# Patient Record
Sex: Female | Born: 1990
Health system: Southern US, Community
[De-identification: ages and names within clinical notes are randomized; demographics above are authoritative.]

## PROBLEM LIST (undated history)

## (undated) HISTORY — PX: NO PAST SURGERIES: SHX2092

---
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
## 2011-03-16 ENCOUNTER — Emergency Department: Payer: Self-pay | Admitting: Unknown Physician Specialty

## 2011-08-13 ENCOUNTER — Emergency Department: Payer: Self-pay | Admitting: Emergency Medicine

## 2013-04-04 ENCOUNTER — Emergency Department: Payer: Self-pay | Admitting: Emergency Medicine

## 2013-04-04 LAB — URINALYSIS, COMPLETE
Glucose,UR: NEGATIVE mg/dL (ref 0–75)
Ketone: NEGATIVE
RBC,UR: 1 /HPF (ref 0–5)
Specific Gravity: 1.029 (ref 1.003–1.030)
Squamous Epithelial: 1
WBC UR: <1 /HPF (ref 0–5)

## 2013-05-29 ENCOUNTER — Emergency Department: Payer: Self-pay | Admitting: Emergency Medicine

## 2013-05-29 LAB — URINALYSIS, COMPLETE
Bilirubin,UR: NEGATIVE
Glucose,UR: NEGATIVE mg/dL (ref 0–75)
Ketone: NEGATIVE
Nitrite: POSITIVE
Ph: 5 (ref 4.5–8.0)
RBC,UR: 9 /HPF (ref 0–5)
Specific Gravity: 1.032 (ref 1.003–1.030)
Squamous Epithelial: 1
WBC UR: 1 /HPF (ref 0–5)

## 2013-05-29 LAB — CBC
HCT: 36.9 % (ref 35.0–47.0)
HGB: 12.8 g/dL (ref 12.0–16.0)
MCH: 27.8 pg (ref 26.0–34.0)
MCHC: 34.7 g/dL (ref 32.0–36.0)
MCV: 80 fL (ref 80–100)
RBC: 4.61 10*6/uL (ref 3.80–5.20)
RDW: 13.5 % (ref 11.5–14.5)

## 2013-05-29 LAB — LIPASE, BLOOD: Lipase: 120 U/L (ref 73–393)

## 2013-05-29 LAB — COMPREHENSIVE METABOLIC PANEL
Alkaline Phosphatase: 63 U/L (ref 50–136)
Anion Gap: 6 — ABNORMAL LOW (ref 7–16)
BUN: 13 mg/dL (ref 7–18)
Bilirubin,Total: 0.3 mg/dL (ref 0.2–1.0)
Chloride: 106 mmol/L (ref 98–107)
Co2: 25 mmol/L (ref 21–32)
EGFR (Non-African Amer.): >60
Glucose: 88 mg/dL (ref 65–99)
Potassium: 3.7 mmol/L (ref 3.5–5.1)
SGPT (ALT): 32 U/L (ref 12–78)
Sodium: 137 mmol/L (ref 136–145)
Total Protein: 7.1 g/dL (ref 6.4–8.2)

## 2013-05-29 LAB — PREGNANCY, URINE: Pregnancy Test, Urine: NEGATIVE m[IU]/mL

## 2013-05-29 LAB — GC/CHLAMYDIA PROBE AMP

## 2013-05-31 LAB — URINE CULTURE

## 2013-08-01 ENCOUNTER — Emergency Department: Payer: Self-pay | Admitting: Emergency Medicine

## 2014-05-21 ENCOUNTER — Encounter (HOSPITAL_BASED_OUTPATIENT_CLINIC_OR_DEPARTMENT_OTHER): Payer: Self-pay | Admitting: Emergency Medicine

## 2014-05-21 ENCOUNTER — Emergency Department (HOSPITAL_BASED_OUTPATIENT_CLINIC_OR_DEPARTMENT_OTHER)
Admission: EM | Admit: 2014-05-21 | Discharge: 2014-05-21 | Disposition: A | Discharge status: Home / Self Care | Payer: Self-pay | Attending: Emergency Medicine | Admitting: Emergency Medicine

## 2014-05-21 DIAGNOSIS — Y929 Unspecified place or not applicable: Secondary | ICD-10-CM | POA: Insufficient documentation

## 2014-05-21 DIAGNOSIS — S239XXA Sprain of unspecified parts of thorax, initial encounter: Secondary | ICD-10-CM | POA: Insufficient documentation

## 2014-05-21 DIAGNOSIS — S29012A Strain of muscle and tendon of back wall of thorax, initial encounter: Secondary | ICD-10-CM

## 2014-05-21 DIAGNOSIS — X58XXXA Exposure to other specified factors, initial encounter: Secondary | ICD-10-CM | POA: Insufficient documentation

## 2014-05-21 DIAGNOSIS — M545 Low back pain, unspecified: Secondary | ICD-10-CM | POA: Insufficient documentation

## 2014-05-21 DIAGNOSIS — R51 Headache: Secondary | ICD-10-CM | POA: Insufficient documentation

## 2014-05-21 DIAGNOSIS — Y939 Activity, unspecified: Secondary | ICD-10-CM | POA: Insufficient documentation

## 2014-05-21 DIAGNOSIS — S233XXA Sprain of ligaments of thoracic spine, initial encounter: Secondary | ICD-10-CM

## 2014-05-21 MED ORDER — CYCLOBENZAPRINE HCL 10 MG PO TABS: 10.0000 mg | ORAL_TABLET | Freq: Three times a day (TID) | ORAL | Status: DC | PRN

## 2014-05-21 NOTE — ED Provider Notes (Signed)
CSN: [id number]     Arrival date & time 05/21/14  1801 History  This chart was scribed for Geoffery Lyons, MD by Charline Bills, ED Scribe. The patient was seen in room MH01/MH01. Patient's care was started at 7:18 PM.   Chief Complaint  Patient presents with  . Headache   Patient is a 23 y.o. female presenting with headaches. The history is provided by the patient. No language interpreter was used.  Headache Pain location:  Generalized Chronicity:  New Associated symptoms: back pain    HPI Comments: Natasha Alvarez is a 23 y.o. female who presents to the Emergency Department complaining of constant HA onset last night. Pt states that she went to a carnival last night prior to pain.   She also reports intermittent generalized back pain over the past week, worsened since rides at the carnival last night. Pt denies injury. She denies urinary or bowel incontinence or any urinary symptoms. She also denies radiation. No h/o similar symptoms. Pt has taken 2 extra strength Tylenol tablets today with no relief.  History reviewed. No pertinent past medical history. History reviewed. No pertinent past surgical history. No family history on file. History  Substance Use Topics  . Smoking status: Never Smoker   . Smokeless tobacco: Not on file  . Alcohol Use: No   OB History   Grav Para Term Preterm Abortions TAB SAB Ect Mult Living                 Review of Systems  Genitourinary: Negative.   Musculoskeletal: Positive for back pain.  Neurological: Positive for headaches.  All other systems reviewed and are negative.  Allergies  Review of patient's allergies indicates no known allergies.  Home Medications   Prior to Admission medications   Not on File   Triage Vitals: BP 138/66  Pulse 72  Temp(Src) 98 F (36.7 C) (Oral)  Resp 16  Ht  (1.727 m)  Wt 220 lb (99.791 kg)  BMI 33.46 kg/m2  SpO2 100%  LMP 05/01/2014 Physical Exam  Nursing note and vitals  reviewed. Constitutional: She is oriented to person, place, and time. She appears well-developed and well-nourished. No distress.  HENT:  Head: Normocephalic and atraumatic.  Eyes: Conjunctivae and EOM are normal.  Neck: Neck supple. No tracheal deviation present.  Cardiovascular: Normal rate.   Pulmonary/Chest: Effort normal. No respiratory distress.  Musculoskeletal: Normal range of motion.  Tenderness to palpation in the soft tissues of the thoracic and lumbar region.   Neurological: She is alert and oriented to person, place, and time.  DTRs are 1+ and equal in BLEs. Strength is 5/5 in the BLEs. She is able to walk on heels and toes without difficulty.   Skin: Skin is warm and dry.  Psychiatric: She has a normal mood and affect. Her behavior is normal.   ED Course  Procedures (including critical care time) DIAGNOSTIC STUDIES: Oxygen Saturation is 100% on RA, normal by my interpretation.    COORDINATION OF CARE: 7:23 PM-Discussed treatment plan which includes muscle relaxants with pt at bedside and pt agreed to plan.   Labs Review Labs Reviewed - No data to display  Imaging Review No results found.   EKG Interpretation None      MDM   Final diagnoses:  None    Patient presents with complaints of headache and back pain. She is tender to palpation in the soft tissues of the thoracic and lumbar region, however there is no bony tenderness  and no trauma. Her reflexes and strength are symmetrical and there is no bowel or bladder complaint. This appears very musculoskeletal in nature and I believe is appropriate for discharge with anti-inflammatories and muscle relaxers. She is to return as needed for any problems.  I personally performed the services described in this documentation, which was scribed in my presence. The recorded information has been reviewed and is accurate.    Geoffery Lyons, MD 05/22/14 [phone]

## 2014-05-21 NOTE — Discharge Instructions (Signed)
Ibuprofen 600 mg 3 times daily for the next 3 days.  Flexeril as prescribed for pain not relieved with ibuprofen.  Followup with your primary Dr. if not improving in the next week.   Back Pain, Adult Low back pain is very common. About 1 in 5 people have back pain.The cause of low back pain is rarely dangerous. The pain often gets better over time.About half of people with a sudden onset of back pain feel better in just 2 weeks. About 8 in 10 people feel better by 6 weeks.  CAUSES Some common causes of back pain include:  Strain of the muscles or ligaments supporting the spine.  Wear and tear (degeneration) of the spinal discs.  Arthritis.  Direct injury to the back. DIAGNOSIS Most of the time, the direct cause of low back pain is not known.However, back pain can be treated effectively even when the exact cause of the pain is unknown.Answering your caregiver's questions about your overall health and symptoms is one of the most accurate ways to make sure the cause of your pain is not dangerous. If your caregiver needs more information, he or she may order lab work or imaging tests (X-rays or MRIs).However, even if imaging tests show changes in your back, this usually does not require surgery. HOME CARE INSTRUCTIONS For many people, back pain returns.Since low back pain is rarely dangerous, it is often a condition that people can learn to [facility] their own.   Remain active. It is stressful on the back to sit or stand in one place. Do not sit, drive, or stand in one place for more than 30 minutes at a time. Take short walks on level surfaces as soon as pain allows.Try to increase the length of time you walk each day.  Do not stay in bed.Resting more than 1 or 2 days can delay your recovery.  Do not avoid exercise or work.Your body is made to move.It is not dangerous to be active, even though your back may hurt.Your back will likely heal faster if you return to being active  before your pain is gone.  Pay attention to your body when you bend and lift. Many people have less discomfortwhen lifting if they bend their knees, keep the load close to their bodies,and avoid twisting. Often, the most comfortable positions are those that put less stress on your recovering back.  Find a comfortable position to sleep. Use a firm mattress and lie on your side with your knees slightly bent. If you lie on your back, put a pillow under your knees.  Only take over-the-counter or prescription medicines as directed by your caregiver. Over-the-counter medicines to reduce pain and inflammation are often the most helpful.Your caregiver may prescribe muscle relaxant drugs.These medicines help dull your pain so you can more quickly return to your normal activities and healthy exercise.  Put ice on the injured area.  Put ice in a plastic bag.  Place a towel between your skin and the bag.  Leave the ice on for 15-20 minutes, 03-04 times a day for the first 2 to 3 days. After that, ice and heat may be alternated to reduce pain and spasms.  Ask your caregiver about trying back exercises and gentle massage. This may be of some benefit.  Avoid feeling anxious or stressed.Stress increases muscle tension and can worsen back pain.It is important to recognize when you are anxious or stressed and learn ways to manage it.Exercise is a great option. SEEK MEDICAL CARE IF:  You have pain that is not relieved with rest or medicine.  You have pain that does not improve in 1 week.  You have new symptoms.  You are generally not feeling well. SEEK IMMEDIATE MEDICAL CARE IF:   You have pain that radiates from your back into your legs.  You develop new bowel or bladder control problems.  You have unusual weakness or numbness in your arms or legs.  You develop nausea or vomiting.  You develop abdominal pain.  You feel faint. Document Released: 08/17/2005 Document Revised: 02/16/2012  Document Reviewed: 12/19/2013 [facility] Patient Information 2015 [place], [place]. This information is not intended to replace advice given to you by your health care provider. Make sure you discuss any questions you have with your health care provider.

## 2014-05-21 NOTE — ED Notes (Signed)
Headache and back pain that started last night.

## 2014-05-21 NOTE — ED Notes (Signed)
MD at bedside. 

## 2014-07-20 ENCOUNTER — Emergency Department: Payer: Self-pay | Admitting: Emergency Medicine

## 2015-09-16 ENCOUNTER — Encounter: Payer: Self-pay | Admitting: *Deleted

## 2015-09-16 ENCOUNTER — Emergency Department
Admission: EM | Admit: 2015-09-16 | Discharge: 2015-09-16 | Disposition: A | Discharge status: Home / Self Care | Payer: Self-pay | Attending: Emergency Medicine | Admitting: Emergency Medicine

## 2015-09-16 DIAGNOSIS — K529 Noninfective gastroenteritis and colitis, unspecified: Secondary | ICD-10-CM | POA: Insufficient documentation

## 2015-09-16 DIAGNOSIS — Z3202 Encounter for pregnancy test, result negative: Secondary | ICD-10-CM | POA: Insufficient documentation

## 2015-09-16 LAB — CBC
HEMATOCRIT: 40.4 % (ref 35.0–47.0)
HEMOGLOBIN: 13.9 g/dL (ref 12.0–16.0)
MCH: 27.5 pg (ref 26.0–34.0)
MCHC: 34.3 g/dL (ref 32.0–36.0)
MCV: 80 fL (ref 80.0–100.0)
Platelets: 204 10*3/uL (ref 150–440)
RBC: 5.05 MIL/uL (ref 3.80–5.20)
RDW: 13 % (ref 11.5–14.5)
WBC: 4.6 10*3/uL (ref 3.6–11.0)

## 2015-09-16 LAB — URINALYSIS COMPLETE WITH MICROSCOPIC (ARMC ONLY)
Bacteria, UA: NONE SEEN
Bilirubin Urine: NEGATIVE
Glucose, UA: NEGATIVE mg/dL
Hgb urine dipstick: NEGATIVE
Leukocytes, UA: NEGATIVE
Nitrite: NEGATIVE
PH: 5 (ref 5.0–8.0)
Protein, ur: NEGATIVE mg/dL
Specific Gravity, Urine: 1.03 (ref 1.005–1.030)

## 2015-09-16 LAB — COMPREHENSIVE METABOLIC PANEL
ALT: 21 U/L (ref 14–54)
ANION GAP: 6 (ref 5–15)
AST: 17 U/L (ref 15–41)
Albumin: 3.9 g/dL (ref 3.5–5.0)
Alkaline Phosphatase: 50 U/L (ref 38–126)
BUN: 12 mg/dL (ref 6–20)
CALCIUM: 9.2 mg/dL (ref 8.9–10.3)
CO2: 25 mmol/L (ref 22–32)
Chloride: 109 mmol/L (ref 101–111)
Creatinine, Ser: 0.82 mg/dL (ref 0.44–1.00)
GFR calc non Af Amer: >60 mL/min (ref >60)
Glucose, Bld: 95 mg/dL (ref 65–99)
POTASSIUM: 3.8 mmol/L (ref 3.5–5.1)
Sodium: 140 mmol/L (ref 135–145)
Total Bilirubin: 0.7 mg/dL (ref 0.3–1.2)
Total Protein: 7.2 g/dL (ref 6.5–8.1)

## 2015-09-16 LAB — POCT PREGNANCY, URINE: Preg Test, Ur: NEGATIVE

## 2015-09-16 LAB — LIPASE, BLOOD: Lipase: 23 U/L (ref 11–51)

## 2015-09-16 MED ORDER — DICYCLOMINE HCL 20 MG PO TABS: 20.0000 mg | ORAL_TABLET | Freq: Three times a day (TID) | ORAL | Status: DC | PRN

## 2015-09-16 MED ORDER — ONDANSETRON 4 MG PO TBDP
4.0000 mg | ORAL_TABLET | Freq: Once | ORAL | Status: AC
  Administered 2015-09-16: 4 mg via ORAL
  Filled 2015-09-16: qty 1

## 2015-09-16 MED ORDER — ONDANSETRON HCL 4 MG PO TABS: 4.0000 mg | ORAL_TABLET | Freq: Every day | ORAL | Status: DC | PRN

## 2015-09-16 NOTE — Discharge Instructions (Signed)
Norovirus Infection °A norovirus infection is caused by exposure to a virus in a group of similar viruses (noroviruses). This type of infection causes inflammation in your stomach and intestines (gastroenteritis). Norovirus is the most common cause of gastroenteritis. It also causes food poisoning. °Anyone can get a norovirus infection. It spreads very easily (contagious). You can get it from contaminated food, water, surfaces, or other people. Norovirus is found in the stool or vomit of infected people. You can spread the infection as soon as you feel sick until 2 weeks after you recover.  °Symptoms usually begin within 2 days after you become infected. Most norovirus symptoms affect the digestive system. °CAUSES °Norovirus infection is caused by contact with norovirus. You can catch norovirus if you: °· Eat or drink something contaminated with norovirus. °· Touch surfaces or objects contaminated with norovirus and then put your hand in your mouth. °· Have direct contact with an infected person who has symptoms. °· Share food, drink, or utensils with someone with who is sick with norovirus. °SIGNS AND SYMPTOMS °Symptoms of norovirus may include: °· Nausea. °· Vomiting. °· Diarrhea. °· Stomach cramps. °· Fever. °· Chills. °· Headache. °· Muscle aches. °· Tiredness. °DIAGNOSIS °Your health care provider may suspect norovirus based on your symptoms and physical exam. Your health care provider may also test a sample of your stool or vomit for the virus.  °TREATMENT °There is no specific treatment for norovirus. Most people get better without treatment in about 2 days. °HOME CARE INSTRUCTIONS °· Replace lost fluids by drinking plenty of water or rehydration fluids containing important minerals called electrolytes. This prevents dehydration. Drink enough fluid to keep your urine clear or pale yellow. °· Do not prepare food for others while you are infected. Wait at least 3 days after recovering from the illness to do  that. °PREVENTION  °· Wash your hands often, especially after using the toilet or changing a diaper. °· Wash fruits and vegetables thoroughly before preparing or serving them. °· Throw out any food that a sick person may have touched. °· Disinfect contaminated surfaces immediately after someone in the household has been sick. Use a bleach-based household cleaner. °· Immediately remove and wash soiled clothes or sheets. °SEEK MEDICAL CARE IF: °· Your vomiting, diarrhea, and stomach pain is getting worse. °· Your symptoms of norovirus do not go away after 2-3 days. °SEEK IMMEDIATE MEDICAL CARE IF:  °You develop symptoms of dehydration that do not improve with fluid replacement. This may include: °· Excessive sleepiness. °· Lack of tears. °· Dry mouth. °· Dizziness when standing. °· Weak pulse. °  °This information is not intended to replace advice given to you by your health care provider. Make sure you discuss any questions you have with your health care provider. °  °Document Released: 11/07/2002 Document Revised: 09/07/2014 Document Reviewed: 01/25/2014 °Elsevier Interactive Patient Education ©2016 [employer] ° °

## 2015-09-16 NOTE — ED Notes (Signed)
Pt reports n/v/d since this am. Pt denies fevers. Pt reports cramping and watery stools. Pt in no acute distress at this time

## 2015-09-16 NOTE — ED Notes (Signed)
Pt reports nausea, vomiting, diarrhea and abdominal pain since last night, pt denies fever and any other symptoms

## 2015-09-16 NOTE — ED Provider Notes (Signed)
[facility] Emergency Department Provider Note     Time seen: ----------------------------------------- 7:26 PM on 09/16/2015 -----------------------------------------    I have reviewed the triage vital signs and the nursing notes.   HISTORY  Chief Complaint Abdominal Pain and Emesis    HPI Natasha Alvarez is a 25 y.o. female who presents ER with nausea vomiting diarrhea since this morning. Patient denies any fevers or chills, reports crampy abdominal pain and watery stools. She states she was sick with vomiting on Thursday, symptoms recurred today. She denies any other complaints, states she is lactose intolerant and some of her symptoms started after eating cereal with milk today   History reviewed. No pertinent past medical history.  There are no active problems to display for this patient.   History reviewed. No pertinent past surgical history.  Allergies Review of patient's allergies indicates no known allergies.  Social History Social History  Substance Use Topics  . Smoking status: Never Smoker   . Smokeless tobacco: None  . Alcohol Use: No    Review of Systems Constitutional: Negative for fever. Eyes: Negative for visual changes. ENT: Negative for sore throat. Cardiovascular: Negative for chest pain. Respiratory: Negative for shortness of breath. Gastrointestinal: Positive for abdominal pain, vomiting and diarrhea Genitourinary: Negative for dysuria. Musculoskeletal: Negative for back pain. Skin: Negative for rash. Neurological: Negative for headaches, focal weakness or numbness.  10-point ROS otherwise negative.  ____________________________________________   PHYSICAL EXAM:  VITAL SIGNS: ED Triage Vitals  Enc Vitals Group     BP 09/16/15 1830 136/77 mmHg     Pulse Rate 09/16/15 1830 73     Resp 09/16/15 1830 20     Temp 09/16/15 1830 98.2 F (36.8 C)     Temp Source 09/16/15 1830 Oral     SpO2 09/16/15 1830 97 %      Weight 09/16/15 1830 240 lb (108.863 kg)     Height 09/16/15 1830 5\' 8"  (1.727 m)     Head Cir --      Peak Flow --      Pain Score 09/16/15 1831 9     Pain Loc --      Pain Edu? --      Excl. in GC? --     Constitutional: Alert and oriented. Well appearing and in no distress. Eyes: Conjunctivae are normal. PERRL. Normal extraocular movements. ENT   Head: Normocephalic and atraumatic.   Nose: No congestion/rhinnorhea.   Mouth/Throat: Mucous membranes are moist.   Neck: No stridor. Cardiovascular: Normal rate, regular rhythm. Normal and symmetric distal pulses are present in all extremities. No murmurs, rubs, or gallops. Respiratory: Normal respiratory effort without tachypnea nor retractions. Breath sounds are clear and equal bilaterally. No wheezes/rales/rhonchi. Gastrointestinal: Soft and nontender. No distention. No abdominal bruits.  Musculoskeletal: Nontender with normal range of motion in all extremities. No joint effusions.  No lower extremity tenderness nor edema. Neurologic:  Normal speech and language. No gross focal neurologic deficits are appreciated. Speech is normal. No gait instability. Skin:  Skin is warm, dry and intact. No rash noted. Psychiatric: Mood and affect are normal. Speech and behavior are normal. Patient exhibits appropriate insight and judgment. ____________________________________________  ED COURSE:  Pertinent labs & imaging results that were available during my care of the patient were reviewed by me and considered in my medical decision making (see chart for details). Patient is no acute distress, will check basic labs and reevaluate. ____________________________________________    LABS (pertinent positives/negatives)  Labs  Reviewed  URINALYSIS COMPLETEWITH MICROSCOPIC ([facility] ONLY) - Abnormal; Notable for the following:    Color, Urine AMBER (*)    APPearance CLEAR (*)    Ketones, ur TRACE (*)    Squamous Epithelial / LPF 0-5  (*)    All other components within normal limits  LIPASE, BLOOD  COMPREHENSIVE METABOLIC PANEL  CBC  POC URINE PREG, ED  POCT PREGNANCY, URINE   ____________________________________________  FINAL ASSESSMENT AND PLAN  Gastroenteritis  Plan: Patient with labs and imaging as dictated above. Patient likely with gastroenteritis. Patient's been on exam with reassuring lab work and urinalysis. She is not pregnant, will be discharged with bentyl and Zofran.   Emily Filbert, MD   Emily Filbert, MD 09/16/15 [phone]

## 2016-04-10 ENCOUNTER — Ambulatory Visit
Admission: EM | Admit: 2016-04-10 | Discharge: 2016-04-10 | Disposition: A | Discharge status: Home / Self Care | Payer: Worker's Compensation | Attending: Emergency Medicine | Admitting: Emergency Medicine

## 2016-04-10 ENCOUNTER — Encounter: Payer: Self-pay | Admitting: *Deleted

## 2016-04-10 DIAGNOSIS — S40261A Insect bite (nonvenomous) of right shoulder, initial encounter: Secondary | ICD-10-CM | POA: Diagnosis not present

## 2016-04-10 DIAGNOSIS — W57XXXA Bitten or stung by nonvenomous insect and other nonvenomous arthropods, initial encounter: Secondary | ICD-10-CM | POA: Diagnosis not present

## 2016-04-10 MED ORDER — LORATADINE 10 MG PO TABS: 10.0000 mg | ORAL_TABLET | Freq: Every day | ORAL | 0 refills | Status: DC

## 2016-04-10 MED ORDER — PREDNISONE 20 MG PO TABS: 40.0000 mg | ORAL_TABLET | Freq: Every day | ORAL | 0 refills | Status: DC

## 2016-04-10 MED ORDER — MUPIROCIN 2 % EX OINT: TOPICAL_OINTMENT | CUTANEOUS | 0 refills | Status: DC

## 2016-04-10 NOTE — ED Triage Notes (Signed)
Patient was bitten by a bug at work yesterday on her back.The bite location is below right shoulder blade posterior. Two red bumps are visible.

## 2016-04-10 NOTE — ED Provider Notes (Signed)
[facility] ____________________________________________  Time seen: Approximately 4:56 PM  I have reviewed the triage vital signs and the nursing notes.   HISTORY Chief Complaint Insect Bite  HPI Natasha Alvarez is a 25 y.o. female presents for evaluation of insect bite to right shoulder. 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 Patient reports that yesterday just prior to leaving work she felt a quick bite to her right posterior shoulder and states that when she got home she noticed that she had 3 bug bites. Patient states that she thought she saw the bug that bit her and it had wings. Denies any other insect bites. Denies any tick bites or tick detachment. Denies any recent sickness. Denies any other triggers. Denies any changes in foods, medicines, lotions, detergents or other contacts.  Patient states that the rash is very itchy. Denies pain. Denies any chest pain, shortness of breath, abdominal pain, dysuria, facial pain, facial swelling, oral swelling, wheezing, extremity pain or actually swelling. Patient states that she feels well other than the itchy rash. Patient states that the itching was unresolved with over-the-counter topical antibiotic and itch medicine.  Patient's last menstrual period was 03/24/2016. Denies concerns pregnancy. Patient states she is not sexually active at this time.   History reviewed. No pertinent past medical history.  There are no active problems to display for this patient.   History reviewed. No pertinent surgical history.  No current facility-administered medications for this encounter.   Current Outpatient Prescriptions:  .  cyclobenzaprine (FLEXERIL) 10 MG tablet, Take 1 tablet (10 mg total) by mouth 3 (three) times daily as needed for muscle spasms., Disp: 10 tablet, Rfl: 0 .  dicyclomine (BENTYL) 20 MG tablet, Take 1 tablet (20 mg total) by mouth 3 (three) times  daily as needed for spasms., Disp: 30 tablet, Rfl: 0 .  loratadine (CLARITIN) 10 MG tablet, Take 1 tablet (10 mg total) by mouth daily., Disp: 7 tablet, Rfl: 0 .  mupirocin ointment (BACTROBAN) 2 %, Apply three times a day for 5 days., Disp: 22 g, Rfl: 0 .  ondansetron (ZOFRAN) 4 MG tablet, Take 1 tablet (4 mg total) by mouth daily as needed for nausea or vomiting., Disp: 20 tablet, Rfl: 1 .  predniSONE (DELTASONE) 20 MG tablet, Take 2 tablets (40 mg total) by mouth daily., Disp: 6 tablet, Rfl: 0  Allergies Amoxicillin  History reviewed. No pertinent family history.  Social History Social History  Substance Use Topics  . Smoking status: Never Smoker  . Smokeless tobacco: Never Used  . Alcohol use No    Review of Systems Constitutional: No fever/chills Eyes: No visual changes. ENT: No sore throat. Cardiovascular: Denies chest pain. Respiratory: Denies shortness of breath. Gastrointestinal: No abdominal pain.  No nausea, no vomiting.  No diarrhea.  No constipation. Genitourinary: Negative for dysuria. Musculoskeletal: Negative for back pain. Skin: Negative for rash.As above. Neurological: Negative for headaches, focal weakness or numbness.  10-point ROS otherwise negative.  ____________________________________________   PHYSICAL EXAM:  VITAL SIGNS: ED Triage Vitals  Enc Vitals Group     BP 04/10/16 1613 124/68     Pulse Rate 04/10/16 1613 65     Resp 04/10/16 1613 18     Temp 04/10/16 1613 98.2 F (36.8 C)     Temp Source 04/10/16 1613 Oral     SpO2 04/10/16 1613 100 %     Weight --      Height --  Head Circumference --      Peak Flow --      Pain Score 04/10/16 1619 0     Pain Loc --      Pain Edu? --      Excl. in GC? --     Constitutional: Alert and oriented. Well appearing and in no acute distress. Eyes: Conjunctivae are normal. PERRL. EOMI. ENT      Head: Normocephalic and atraumatic.      Nose: No congestion/rhinnorhea.      Mouth/Throat: Mucous  membranes are moist.Oropharynx non-erythematous. Neck: No stridor. Supple without meningismus.  Hematological/Lymphatic/Immunilogical: No cervical lymphadenopathy. Cardiovascular: Normal rate, regular rhythm. Grossly normal heart sounds.  Good peripheral circulation. Respiratory: Normal respiratory effort without tachypnea nor retractions. Breath sounds are clear and equal bilaterally. No wheezes/rales/rhonchi.. Gastrointestinal: Soft and nontender. No distention. Normal Bowel sounds. No CVA tenderness. Musculoskeletal:  Nontender with normal range of motion in all extremities. No midline cervical, thoracic or lumbar tenderness to palpation. Bilateral pedal pulses equal and easily palpated.      Right lower leg:  No tenderness or edema.      Left lower leg:  No tenderness or edema.  Neurologic:  Normal speech and language. No gross focal neurologic deficits are appreciated. Speech is normal. No gait instability.  Skin:  Skin is warm, dry and intact. No rash noted. Except: Patient with urticarial appearing rash to right posterior shoulder that is clustered together with centered punctums, minimally erythematous, no fluctuance, no drainage, no induration, no surrounding erythema or swelling, pruritic. Psychiatric: Mood and affect are normal. Speech and behavior are normal. Patient exhibits appropriate insight and judgment   ___________________________________________   LABS (all labs ordered are listed, but only abnormal results are displayed)  Labs Reviewed - No data to display ____________________________________________  PROCEDURES Procedures     INITIAL IMPRESSION / ASSESSMENT AND PLAN / ED COURSE  Pertinent labs & imaging results that were available during my care of the patient were reviewed by me and considered in my medical decision making (see chart for details).  Well-appearing patient. No acute distress. Presents for evaluation of right posterior shoulder insect bites that  occurred yesterday. Patient denies tick bite or tick exposure. Denies pain. Patient states that the rash is very itchy. Patient right posterior shoulder with 3 areas that are slightly raised and urticarial. Rash appearance consistent local reaction from insect bite. Counseled regarding not to scratch and close monitoring at home. Will treat patient with 3 days of oral prednisone, oral Claritin and topical Bactroban.Discussed indication, risks and benefits of medications with patient.  Discussed follow up with Primary care physician this week. Discussed follow up and return parameters including no resolution or any worsening concerns. Patient verbalized understanding and agreed to plan.   ____________________________________________   FINAL CLINICAL IMPRESSION(S) / ED DIAGNOSES  Final diagnoses:  Insect bite of right shoulder with local reaction, initial encounter     Discharge Medication List as of 04/10/2016  5:00 PM    START taking these medications   Details  loratadine (CLARITIN) 10 MG tablet Take 1 tablet (10 mg total) by mouth daily., Starting Fri 04/10/2016, Normal    mupirocin ointment (BACTROBAN) 2 % Apply three times a day for 5 days., Normal    predniSONE (DELTASONE) 20 MG tablet Take 2 tablets (40 mg total) by mouth daily., Starting Fri 04/10/2016, Normal        Note: This dictation was prepared with Dragon dictation along with smaller phrase technology. Any transcriptional  errors that result from this process are unintentional.    Clinical Course      Renford Dills, NP 04/11/16 1023

## 2016-04-10 NOTE — Discharge Instructions (Signed)
Take medication as prescribed. Rest. Drink plenty of fluids. Avoid scratching.   Follow up with your primary care physician this week as needed. Return to Urgent care for new or worsening concerns.   

## 2017-10-10 ENCOUNTER — Other Ambulatory Visit: Payer: Self-pay

## 2017-10-10 ENCOUNTER — Emergency Department
Admission: EM | Admit: 2017-10-10 | Discharge: 2017-10-10 | Disposition: A | Discharge status: Home / Self Care | Payer: BLUE CROSS/BLUE SHIELD | Attending: Emergency Medicine | Admitting: Emergency Medicine

## 2017-10-10 ENCOUNTER — Encounter: Payer: Self-pay | Admitting: Emergency Medicine

## 2017-10-10 DIAGNOSIS — R1110 Vomiting, unspecified: Secondary | ICD-10-CM | POA: Insufficient documentation

## 2017-10-10 DIAGNOSIS — J029 Acute pharyngitis, unspecified: Secondary | ICD-10-CM | POA: Diagnosis not present

## 2017-10-10 DIAGNOSIS — Z79899 Other long term (current) drug therapy: Secondary | ICD-10-CM | POA: Insufficient documentation

## 2017-10-10 MED ORDER — ONDANSETRON 4 MG PO TBDP
4.0000 mg | ORAL_TABLET | Freq: Once | ORAL | Status: AC
  Administered 2017-10-10: 4 mg via ORAL
  Filled 2017-10-10: qty 1

## 2017-10-10 MED ORDER — GUAIFENESIN-CODEINE 100-10 MG/5ML PO SOLN
5.0000 mL | Freq: Once | ORAL | Status: AC
  Administered 2017-10-10: 5 mL via ORAL
  Filled 2017-10-10: qty 5

## 2017-10-10 MED ORDER — ONDANSETRON HCL 4 MG PO TABS: 4.0000 mg | ORAL_TABLET | Freq: Every day | ORAL | 0 refills | Status: AC | PRN

## 2017-10-10 MED ORDER — LIDOCAINE VISCOUS 2 % MT SOLN: 10.0000 mL | OROMUCOSAL | 0 refills | Status: DC | PRN

## 2017-10-10 MED ORDER — LIDOCAINE VISCOUS 2 % MT SOLN
15.0000 mL | Freq: Once | OROMUCOSAL | Status: AC
  Administered 2017-10-10: 15 mL via OROMUCOSAL
  Filled 2017-10-10: qty 15

## 2017-10-10 MED ORDER — GUAIFENESIN-CODEINE 100-10 MG/5ML PO SOLN: 5.0000 mL | Freq: Three times a day (TID) | ORAL | 0 refills | Status: DC | PRN

## 2017-10-10 NOTE — ED Triage Notes (Addendum)
Pt reports sore throat, URI sx, reports fatigue. Reports pain when coughing. Pt ambulatory to triage room.

## 2017-10-10 NOTE — ED Provider Notes (Signed)
[facility] Emergency Department Provider Note  ____________________________________________  Time seen: Approximately 8:44 AM  I have reviewed the triage vital signs and the nursing notes.   HISTORY  Chief Complaint Sore Throat    HPI Natasha Alvarez is a 27 y.o. female that presents to the emergency department for evaluation of sore throat and vomiting for 3 days.  Patient does not feel nauseated but she is vomiting certain cough medications.  She has been able to keep down Delsym and liquids. She has an occasional nonproductive cough.  Partner is sick with similar symptoms but is able to tolerate cough medication.  She is on her menstrual cycle.  No fever, chills, nasal congestion, abdominal pain.  History reviewed. No pertinent past medical history.  There are no active problems to display for this patient.   History reviewed. No pertinent surgical history.  Prior to Admission medications   Medication Sig Start Date End Date Taking? Authorizing Provider  cyclobenzaprine (FLEXERIL) 10 MG tablet Take 1 tablet (10 mg total) by mouth 3 (three) times daily as needed for muscle spasms. 05/21/14   Geoffery Lyons, MD  dicyclomine (BENTYL) 20 MG tablet Take 1 tablet (20 mg total) by mouth 3 (three) times daily as needed for spasms. 09/16/15 09/15/16  Emily Filbert, MD  guaiFENesin-codeine 100-10 MG/5ML syrup Take 5 mLs by mouth 3 (three) times daily as needed for cough. 10/10/17   Enid Derry, PA-C  lidocaine (XYLOCAINE) 2 % solution Use as directed 10 mLs in the mouth or throat as needed for mouth pain. 10/10/17   Enid Derry, PA-C  loratadine (CLARITIN) 10 MG tablet Take 1 tablet (10 mg total) by mouth daily. 04/10/16   Renford Dills, NP  mupirocin ointment (BACTROBAN) 2 % Apply three times a day for 5 days. 04/10/16   Renford Dills, NP  ondansetron [facility]) 4 MG tablet Take 1 tablet (4 mg total) by mouth daily as needed for nausea or vomiting. 10/10/17  10/10/18  Enid Derry, PA-C  predniSONE (DELTASONE) 20 MG tablet Take 2 tablets (40 mg total) by mouth daily. 04/10/16   Renford Dills, NP    Allergies Amoxicillin  No family history on file.  Social History Social History   Tobacco Use  . Smoking status: Never Smoker  . Smokeless tobacco: Never Used  Substance Use Topics  . Alcohol use: No  . Drug use: No     Review of Systems  Constitutional: No fever/chills Eyes: No visual changes. No discharge. ENT: Negative for congestion and rhinorrhea. Cardiovascular: No chest pain. Respiratory: Positive for cough. No SOB. Gastrointestinal: No abdominal pain. No diarrhea.  No constipation. Musculoskeletal: Positive for body aches. Skin: Negative for rash, abrasions, lacerations, ecchymosis. Neurological: Negative for headaches.   ____________________________________________   PHYSICAL EXAM:  VITAL SIGNS: ED Triage Vitals  Enc Vitals Group     BP 10/10/17 0817 (!) 143/81     Pulse Rate 10/10/17 0817 74     Resp 10/10/17 0817 16     Temp 10/10/17 0817 99 F (37.2 C)     Temp Source 10/10/17 0817 Oral     SpO2 10/10/17 0817 99 %     Weight 10/10/17 0818 238 lb (108 kg)     Height 10/10/17 0818 5\' 9"  (1.753 m)     Head Circumference --      Peak Flow --      Pain Score 10/10/17 0818 8     Pain Loc --  Pain Edu? --      Excl. in GC? --      Constitutional: Alert and oriented. Well appearing and in no acute distress. Eyes: Conjunctivae are normal. PERRL. EOMI. No discharge. Head: Atraumatic. ENT: No frontal and maxillary sinus tenderness.      Ears: Tympanic membranes pearly gray with good landmarks. No discharge.      Nose: No congestion/rhinnorhea.      Mouth/Throat: Mucous membranes are moist. Oropharynx mildly erythematous. Tonsils not enlarged. No exudates. Uvula midline. Neck: No stridor.   Hematological/Lymphatic/Immunilogical: No cervical lymphadenopathy. Cardiovascular: Normal rate, regular rhythm.   Good peripheral circulation. Respiratory: Normal respiratory effort without tachypnea or retractions. Lungs CTAB. Good air entry to the bases with no decreased or absent breath sounds. Gastrointestinal: Bowel sounds 4 quadrants. Soft and nontender to palpation. No guarding or rigidity. No palpable masses. No distention. Musculoskeletal: Full range of motion to all extremities. No gross deformities appreciated. Neurologic:  Normal speech and language. No gross focal neurologic deficits are appreciated.  Skin:  Skin is warm, dry and intact. No rash noted.   ____________________________________________   LABS (all labs ordered are listed, but only abnormal results are displayed)  Labs Reviewed - No data to display ____________________________________________  EKG   ____________________________________________  RADIOLOGY   No results found.  ____________________________________________    PROCEDURES  Procedure(s) performed:    Procedures    Medications  ondansetron (ZOFRAN-ODT) disintegrating tablet 4 mg (4 mg Oral Given 10/10/17 0922)  lidocaine (XYLOCAINE) 2 % viscous mouth solution 15 mL (15 mLs Mouth/Throat Given 10/10/17 0922)  guaiFENesin-codeine 100-10 MG/5ML solution 5 mL (5 mLs Oral Given 10/10/17 1024)     ____________________________________________   INITIAL IMPRESSION / ASSESSMENT AND PLAN / ED COURSE  Pertinent labs & imaging results that were available during my care of the patient were reviewed by me and considered in my medical decision making (see chart for details).  Review of the [place] CSRS was performed in accordance of the [facility] prior to dispensing any controlled drugs.    Patient's diagnosis is consistent with viral pharyngitis.  Vital signs and exam are reassuring.  Patient was given Zofran and has been able to tolerate cough medication and liquids since.  She felt better after medication.  Patient feels comfortable going home. Patient will be  discharged home with prescriptions for viscous lidocaine. Patient is to follow up with Robitussin, viscous lidocaine, Zofran as needed or otherwise directed. Patient is given ED precautions to return to the ED for any worsening or new symptoms.     ____________________________________________  FINAL CLINICAL IMPRESSION(S) / ED DIAGNOSES  Final diagnoses:  Viral pharyngitis      NEW MEDICATIONS STARTED DURING THIS VISIT:  ED Discharge Orders        Ordered    lidocaine (XYLOCAINE) 2 % solution  As needed     10/10/17 1048    guaiFENesin-codeine 100-10 MG/5ML syrup  3 times daily PRN     10/10/17 1048    ondansetron (ZOFRAN) 4 MG tablet  Daily PRN     10/10/17 1048          This chart was dictated using voice recognition software/Dragon. Despite best efforts to proofread, errors can occur which can change the meaning. Any change was purely unintentional.    Enid DerryWagner, Rishaan Gunner, PA-C 10/10/17 1153    Jeanmarie PlantMcShane, James A, MD 10/10/17 1236

## 2018-10-03 DIAGNOSIS — O3680X0 Pregnancy with inconclusive fetal viability, not applicable or unspecified: Secondary | ICD-10-CM | POA: Diagnosis not present

## 2018-10-10 DIAGNOSIS — O3680X0 Pregnancy with inconclusive fetal viability, not applicable or unspecified: Secondary | ICD-10-CM | POA: Diagnosis not present

## 2018-10-10 DIAGNOSIS — O200 Threatened abortion: Secondary | ICD-10-CM | POA: Diagnosis not present

## 2018-10-28 ENCOUNTER — Telehealth: Payer: Self-pay | Admitting: Obstetrics & Gynecology

## 2018-10-28 NOTE — Telephone Encounter (Signed)
We have received record from [facility] for patient to schedule appointment. I attempt to reach patient to schedule appointment no answer and voicemail box is not set up.

## 2018-10-31 ENCOUNTER — Telehealth: Payer: Self-pay

## 2018-10-31 NOTE — Telephone Encounter (Signed)
Pt got sick this morning at work; afterwards she had sharp pain across her stomach and upper back hurts really bad; crampy.  [phone]  VM not set up yet.

## 2018-11-09 ENCOUNTER — Other Ambulatory Visit: Payer: Self-pay

## 2018-11-09 ENCOUNTER — Other Ambulatory Visit (HOSPITAL_COMMUNITY)
Admission: RE | Admit: 2018-11-09 | Discharge: 2018-11-09 | Disposition: A | Discharge status: Home / Self Care | Payer: BLUE CROSS/BLUE SHIELD | Source: Ambulatory Visit | Attending: Maternal Newborn | Admitting: Maternal Newborn

## 2018-11-09 ENCOUNTER — Ambulatory Visit (INDEPENDENT_AMBULATORY_CARE_PROVIDER_SITE_OTHER): Payer: BLUE CROSS/BLUE SHIELD | Admitting: Maternal Newborn

## 2018-11-09 ENCOUNTER — Encounter: Payer: Self-pay | Admitting: Maternal Newborn

## 2018-11-09 VITALS — BP 118/70 | HR 84 | Wt 229.0 lb

## 2018-11-09 DIAGNOSIS — Z3401 Encounter for supervision of normal first pregnancy, first trimester: Secondary | ICD-10-CM | POA: Insufficient documentation

## 2018-11-09 DIAGNOSIS — Z124 Encounter for screening for malignant neoplasm of cervix: Secondary | ICD-10-CM

## 2018-11-09 DIAGNOSIS — Z3A10 10 weeks gestation of pregnancy: Secondary | ICD-10-CM

## 2018-11-09 LAB — POCT URINALYSIS DIPSTICK OB
Appearance: NORMAL
BILIRUBIN UA: NEGATIVE
Blood, UA: NEGATIVE
GLUCOSE, UA: NEGATIVE
Ketones, UA: NEGATIVE
Leukocytes, UA: NEGATIVE
Nitrite, UA: NEGATIVE
Odor: NORMAL
Spec Grav, UA: 1.02 (ref 1.010–1.025)
Urobilinogen, UA: 0.2 E.U./dL
pH, UA: 6 (ref 5.0–8.0)
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
## 2018-11-09 NOTE — Telephone Encounter (Signed)
Pt has appt today c [facility].

## 2018-11-11 LAB — URINE CULTURE: Organism ID, Bacteria: NO GROWTH

## 2018-11-12 LAB — URINE DRUG PANEL 7
AMPHETAMINES, URINE: NEGATIVE ng/mL
BARBITURATE QUANT UR: NEGATIVE ng/mL
Benzodiazepine Quant, Ur: NEGATIVE ng/mL
CANNABINOID QUANT UR: POSITIVE — AB
Cocaine (Metab.): NEGATIVE ng/mL
Opiate Quant, Ur: NEGATIVE ng/mL
PCP Quant, Ur: NEGATIVE ng/mL

## 2018-11-15 ENCOUNTER — Encounter: Payer: Self-pay | Admitting: Maternal Newborn

## 2018-11-15 DIAGNOSIS — Z3490 Encounter for supervision of normal pregnancy, unspecified, unspecified trimester: Secondary | ICD-10-CM | POA: Insufficient documentation

## 2018-11-15 LAB — CYTOLOGY - PAP
ADEQUACY: ABSENT
Chlamydia: NEGATIVE
DIAGNOSIS: NEGATIVE
Neisseria Gonorrhea: NEGATIVE

## 2018-11-30 ENCOUNTER — Other Ambulatory Visit: Payer: BLUE CROSS/BLUE SHIELD

## 2018-11-30 ENCOUNTER — Encounter: Payer: Self-pay | Admitting: Advanced Practice Midwife

## 2018-11-30 ENCOUNTER — Ambulatory Visit (INDEPENDENT_AMBULATORY_CARE_PROVIDER_SITE_OTHER): Payer: BLUE CROSS/BLUE SHIELD | Admitting: Advanced Practice Midwife

## 2018-11-30 ENCOUNTER — Other Ambulatory Visit: Payer: Self-pay

## 2018-11-30 VITALS — BP 122/70 | Wt 232.0 lb

## 2018-11-30 DIAGNOSIS — Z3401 Encounter for supervision of normal first pregnancy, first trimester: Secondary | ICD-10-CM

## 2018-11-30 DIAGNOSIS — Z3400 Encounter for supervision of normal first pregnancy, unspecified trimester: Secondary | ICD-10-CM | POA: Diagnosis not present

## 2018-11-30 DIAGNOSIS — Z3A14 14 weeks gestation of pregnancy: Secondary | ICD-10-CM

## 2018-11-30 DIAGNOSIS — Z3402 Encounter for supervision of normal first pregnancy, second trimester: Secondary | ICD-10-CM

## 2018-11-30 DIAGNOSIS — M659 Synovitis and tenosynovitis, unspecified: Secondary | ICD-10-CM | POA: Insufficient documentation

## 2018-11-30 LAB — OB RESULTS CONSOLE VARICELLA ZOSTER ANTIBODY, IGG: Varicella: IMMUNE

## 2018-11-30 MED ORDER — DOXYLAMINE-PYRIDOXINE 10-10 MG PO TBEC: 2.0000 | DELAYED_RELEASE_TABLET | Freq: Every day | ORAL | 1 refills | Status: DC

## 2018-11-30 NOTE — Progress Notes (Signed)
ROB/NOB labs and GTT C/o nausea in the mornings and sometimes vomiting  Would like to do prometrium shot instead of pills

## 2018-11-30 NOTE — Patient Instructions (Signed)
Eating Plan for Pregnant Women °While you are pregnant, your body requires additional nutrition to help support your growing baby. You also have a higher need for some vitamins and minerals, such as folic acid, calcium, iron, and vitamin D. Eating a healthy, well-balanced diet is very important for your health and your baby's health. Your need for extra calories varies for the three 3-month segments of your pregnancy (trimesters). For most women, it is recommended to consume: °· 150 extra calories a day during the first trimester. °· 300 extra calories a day during the second trimester. °· 300 extra calories a day during the third trimester. °What are tips for following this plan? ° °· Do not try to lose weight or go on a diet during pregnancy. °· Limit your overall intake of foods that have "empty calories." These are foods that have little nutritional value, such as sweets, desserts, candies, and sugar-sweetened beverages. °· Eat a variety of foods (especially fruits and vegetables) to get a full range of vitamins and minerals. °· Take a prenatal vitamin to help meet your additional vitamin and mineral needs during pregnancy, specifically for folic acid, iron, calcium, and vitamin D. °· Remember to stay active. Ask your health care provider what types of exercise and activities are safe for you. °· Practice good food safety and cleanliness. Wash your hands before you eat and after you prepare raw meat. Wash all fruits and vegetables well before peeling or eating. Taking these actions can help to prevent food-borne illnesses that can be very dangerous to your baby, such as listeriosis. Ask your health care provider for more information about listeriosis. °What does 150 extra calories look like? °Healthy options that provide 150 extra calories each day could be any of the following: °· 6-8 oz (170-230 g) of plain low-fat yogurt with ½ cup of berries. °· 1 apple with 2 teaspoons (11 g) of peanut butter. °· Cut-up  vegetables with ¼ cup (60 g) of hummus. °· 8 oz (230 mL) or 1 cup of low-fat chocolate milk. °· 1 stick of string cheese with 1 medium orange. °· 1 peanut butter and jelly sandwich that is made with one slice of whole-wheat bread and 1 tsp (5 g) of peanut butter. °For 300 extra calories, you could eat two of those healthy options each day. °What is a healthy amount of weight to gain? °The right amount of weight gain for you is based on your BMI before you became pregnant. If your BMI: °· Was less than 18 (underweight), you should gain 28-40 lb (13-18 kg). °· Was 18-24.9 (normal), you should gain 25-35 lb (11-16 kg). °· Was 25-29.9 (overweight), you should gain 15-25 lb (7-11 kg). °· Was 30 or greater (obese), you should gain 11-20 lb (5-9 kg). °What if I am having twins or multiples? °Generally, if you are carrying twins or multiples: °· You may need to eat 300-600 extra calories a day. °· The recommended range for total weight gain is 25-54 lb (11-25 kg), depending on your BMI before pregnancy. °· Talk with your health care provider to find out about nutritional needs, weight gain, and exercise that is right for you. °What foods can I eat? ° °Grains °All grains. Choose whole grains, such as whole-wheat bread, oatmeal, or Vandoren rice. °Vegetables °All vegetables. Eat a variety of colors and types of vegetables. Remember to wash your vegetables well before peeling or eating. °Fruits °All fruits. Eat a variety of colors and types of fruit. Remember to wash   your fruits well before peeling or eating. Meats and other protein foods Lean meats, including chicken, [place], fish, and lean cuts of beef, veal, or pork. If you eat fish or seafood, choose options that are higher in omega-3 fatty acids and lower in mercury, such as salmon, herring, mussels, trout, sardines, pollock, shrimp, crab, and lobster. Tofu. Tempeh. Beans. Eggs. Peanut butter and other nut butters. Make sure that all meats, poultry, and eggs are cooked to  food-safe temperatures or "well-done." Two or more servings of fish are recommended each week in order to get the most benefits from omega-3 fatty acids that are found in seafood. Choose fish that are lower in mercury. You can find more information online:  [place]Gaming.ch Dairy Pasteurized milk and milk alternatives (such as almond milk). Pasteurized yogurt and pasteurized cheese. Cottage cheese. Sour cream. Beverages Water. Juices that contain 100% fruit juice or vegetable juice. Caffeine-free teas and decaffeinated coffee. Drinks that contain caffeine are okay to drink, but it is better to avoid caffeine. Keep your total caffeine intake to less than 200 mg each day (which is 12 oz or 355 mL of coffee, tea, or soda) or the limit as told by your health care provider. Fats and oils Fats and oils are okay to include in moderation. Sweets and desserts Sweets and desserts are okay to include in moderation. Seasoning and other foods All pasteurized condiments. The items listed above may not be a complete list of recommended foods and beverages. Contact your dietitian for more options. What foods are not recommended? Vegetables Raw (unpasteurized) vegetable juices. Fruits Unpasteurized fruit juices. Meats and other protein foods Lunch meats, bologna, hot dogs, or other deli meats. (If you must eat those meats, reheat them until they are steaming hot.) Refrigerated pat, meat spreads from a meat counter, smoked seafood that is found in the refrigerated section of a store. Raw or undercooked meats, poultry, and eggs. Raw fish, such as sushi or sashimi. Fish that have high mercury content, such as tilefish, shark, swordfish, and king mackerel. To learn more about mercury in fish, talk with your health care provider or look for online resources, such as:  [place]Gaming.ch Dairy Raw (unpasteurized) milk and any foods that have raw milk in them. Soft cheeses, such as feta, queso blanco, queso fresco, Brie,  Camembert cheeses, blue-veined cheeses, and Panela cheese (unless it is made with pasteurized milk, which must be stated on the label). Beverages Alcohol. Sugar-sweetened beverages, such as sodas, teas, or energy drinks. Seasoning and other foods Homemade fermented foods and drinks, such as pickles, sauerkraut, or kombucha drinks. (Store-bought pasteurized versions of these are okay.) Salads that are made in a store or deli, such as ham salad, chicken salad, egg salad, tuna salad, and seafood salad. The items listed above may not be a complete list of foods and beverages to avoid. Contact your dietitian for more information. Where to find more information To calculate the number of calories you need based on your height, weight, and activity level, you can use an online calculator such as:  MobileTransition.ch To calculate how much weight you should gain during pregnancy, you can use an online pregnancy weight gain calculator such as:  StreamingFood.com.cy Summary  While you are pregnant, your body requires additional nutrition to help support your growing baby.  Eat a variety of foods, especially fruits and vegetables to get a full range of vitamins and minerals.  Practice good food safety and cleanliness. Wash your hands before you eat and after you  prepare raw meat. Wash all fruits and vegetables well before peeling or eating. Taking these actions can help to prevent food-borne illnesses, such as listeriosis, that can be very dangerous to your baby.  Do not eat raw meat or fish. Do not eat fish that have high mercury content, such as tilefish, shark, swordfish, and king mackerel. Do not eat unpasteurized (raw) dairy.  Take a prenatal vitamin to help meet your additional vitamin and mineral needs during pregnancy, specifically for folic acid, iron, calcium, and vitamin D. This information is not intended to replace advice given to you by your  health care provider. Make sure you discuss any questions you have with your health care provider. Document Released: 06/01/2014 Document Revised: 05/14/2017 Document Reviewed: 05/14/2017 Elsevier Interactive Patient Education  2019 [employer] Exercise During Pregnancy For people of all ages, exercise is an important part of being healthy. Exercise improves heart and lung function and helps to maintain strength, flexibility, and a healthy body weight. Exercise also boosts energy levels and elevates mood. For most women, maintaining an exercise routine throughout pregnancy is recommended. It is only on rare occasions and with certain medical conditions or pregnancy complications that women may be asked to limit or avoid exercise during pregnancy. What are some other benefits to exercising during pregnancy? Along with maintaining strength and flexibility, exercising throughout pregnancy can help to:  Keep strength in muscles that are very important during labor and childbirth.  Decrease low back pain during pregnancy.  Decrease the risk of developing gestational diabetes mellitus (GDM).  Improve blood sugar (glucose) control for women who have GDM.  Decrease the risk of developing preeclampsia. This is a serious condition that causes high blood pressure along with other symptoms, such as swelling and headaches.  Decrease the risk of cesarean delivery.  Speed up the recovery after giving birth. How often should I exercise? Unless your health care provider gives you different instructions, you should try to exercise on most days or all days of the week. In general, try to exercise with moderate intensity for about 150 minutes per week. This can be spread out across several days, such as exercising for 30 minutes per day on 5 days of each week. You can tell that you are exercising at a moderate intensity if you have a higher heart rate and faster breathing, but you are still able to hold a  conversation. What types of moderate-intensity exercise are recommended during pregnancy? There are many types of exercise that are safe for you to do during pregnancy. Unless your health care provider gives you different instructions, do a variety of exercises that safely increase your heart and breathing (cardiopulmonary) rates and help you to build and maintain muscle strength (strength training). You should always be able to talk in full sentences while exercising during pregnancy. Some examples of exercising that is safe to do during pregnancy include:  Brisk walking or hiking.  Swimming.  Water aerobics.  Riding a stationary bike.  Strength training.  Modified yoga or Pilates. Tell your instructor that you are pregnant. Avoid overstretching and avoid lying on your back for long periods of time.  Running or jogging. Only choose this type of exercise if: ? You ran or jogged regularly before your pregnancy. ? You can run or jog and still talk in complete sentences. What types of exercise should I not do during pregnancy? Depending on your level of fitness and whether you exercised regularly before your pregnancy, you may be advised to  limit vigorous-intensity exercise during your pregnancy. You can tell that you are exercising at a vigorous intensity if you are breathing much harder and faster and cannot hold a conversation while exercising. Some examples of exercising that you should avoid during pregnancy include:  Contact sports.  Activities that place you at risk for falling on or being hit in the belly, such as downhill skiing, water skiing, surfing, rock climbing, cycling, gymnastics, and horseback riding.  Scuba diving.  Sky diving.  Yoga or Pilates in a room that is heated to extreme temperatures ("hot yoga" or "hot Pilates").  Jogging or running, unless you ran or jogged regularly before your pregnancy. While jogging or running, you should always be able to talk in full  sentences. Do not run or jog so vigorously that you are unable to have a conversation.  If you are not used to exercising at elevation (more than 6,000 feet above sea level), do not do so during your pregnancy. When should I avoid exercising during pregnancy? Certain medical conditions can make it unsafe to exercise during pregnancy, or they may increase your risk of miscarriage or early labor and birth. Some of these conditions include:  Some types of heart disease.  Some types of lung disease.  Placenta previa. This is when the placenta partially or completely covers the opening of the uterus (cervix).  Frequent bleeding from the vagina during your pregnancy.  Incompetent cervix. This is when your cervix does not remain as tightly closed during pregnancy as it should.  Premature labor.  Ruptured membranes. This is when the protective sac (amniotic sac) opens up and amniotic fluid leaks from your vagina.  Severely low blood count (anemia).  Preeclampsia or pregnancy-caused high blood pressure.  Carrying more than one baby (multiple gestation) and having an additional risk of early labor.  Poorly controlled diabetes.  Being severely underweight or severely overweight.  Intrauterine growth restriction. This is when your baby's growth and development during pregnancy are slower than expected.  Other medical conditions. Ask your health care provider if any apply to you. What else should I know about exercising during pregnancy? You should take these precautions while exercising during pregnancy:  Avoid overheating. ? Wear loose-fitting, breathable clothes. ? Do not exercise in very high temperatures.  Avoid dehydration. Drink enough water before, during, and after exercise to keep your urine clear or pale yellow.  Avoid overstretching. Because of hormone changes during pregnancy, it is easy to overstretch muscles, tendons, and ligaments during pregnancy.  Start slowly and ask  your health care provider to recommend types of exercise that are safe for you, if exercising regularly is new for you. Pregnancy is not a time for exercising to lose weight. When should I seek medical care? You should stop exercising and call your health care provider if you have any unusual symptoms, such as:  Mild uterine contractions or abdominal cramping.  Dizziness that does not improve with rest. When should I seek immediate medical care? You should stop exercising and call your local emergency services (911 in the [place] if you have any unusual symptoms, such as:  Sudden, severe pain in your low back or your belly.  Uterine contractions or abdominal cramping that do not improve with rest.  Chest pain.  Bleeding or fluid leaking from your vagina.  Shortness of breath. This information is not intended to replace advice given to you by your health care provider. Make sure you discuss any questions you have with your health care  provider. Document Released: 08/17/2005 Document Revised: 01/15/2016 Document Reviewed: 10/25/2014 Elsevier Interactive Patient Education  2019 [employer]. Prenatal Care Prenatal care is health care during pregnancy. It helps you and your unborn baby (fetus) stay as healthy as possible. Prenatal care may be provided by a midwife, a family practice health care provider, or a childbirth and pregnancy specialist (obstetrician). How does this affect me? During pregnancy, you will be closely monitored for any new conditions that might develop. To lower your risk of pregnancy complications, you and your health care provider will talk about any underlying conditions you have. How does this affect my baby? Early and consistent prenatal care increases the chance that your baby will be healthy during pregnancy. Prenatal care lowers the risk that your baby will be:  Born early (prematurely).  Smaller than expected at birth (small for gestational age). What can I  expect at the first prenatal care visit? Your first prenatal care visit will likely be the longest. You should schedule your first prenatal care visit as soon as you know that you are pregnant. Your first visit is a good time to talk about any questions or concerns you have about pregnancy. At your visit, you and your health care provider will talk about:  Your medical history, including: ? Any past pregnancies. ? Your family's medical history. ? The baby's father's medical history. ? Any long-term (chronic) health conditions you have and how you manage them. ? Any surgeries or procedures you have had. ? Any current over-the-counter or prescription medicines, herbs, or supplements you are taking.  Other factors that could pose a risk to your baby, including:  Your home setting and your stress levels, including: ? Exposure to abuse or violence. ? Household financial strain. ? Mental health conditions you have.  Your daily health habits, including diet and exercise. Your health care provider will also:  Measure your weight, height, and blood pressure.  Do a physical exam, including a pelvic and breast exam.  Perform blood tests and urine tests to check for: ? Urinary tract infection. ? Sexually transmitted infections (STIs). ? Low iron levels in your blood (anemia). ? Blood type and certain proteins on red blood cells (Rh antibodies). ? Infections and immunity to viruses, such as hepatitis B and rubella. ? HIV (human immunodeficiency virus).  Do an ultrasound to confirm your baby's growth and development and to help predict your estimated due date (EDD). This ultrasound is done with a probe that is inserted into the vagina (transvaginal ultrasound).  Discuss your options for genetic screening.  Give you information about how to keep yourself and your baby healthy, including: ? Nutrition and taking vitamins. ? Physical activity. ? How to manage pregnancy symptoms such as nausea  and vomiting (morning sickness). ? Infections and substances that may be harmful to your baby and how to avoid them. ? Food safety. ? Dental care. ? Working. ? Travel. ? Warning signs to watch for and when to call your health care provider. How often will I have prenatal care visits? After your first prenatal care visit, you will have regular visits throughout your pregnancy. The visit schedule is often as follows:  Up to week 28 of pregnancy: once every 4 weeks.  28-36 weeks: once every 2 weeks.  After 36 weeks: every week until delivery. Some women may have visits more or less often depending on any underlying health conditions and the health of the baby. Keep all follow-up and prenatal care visits as told by  your health care provider. This is important. What happens during routine prenatal care visits? Your health care provider will:  Measure your weight and blood pressure.  Check for fetal heart sounds.  Measure the height of your uterus in your abdomen (fundal height). This may be measured starting around week 20 of pregnancy.  Check the position of your baby inside your uterus.  Ask questions about your diet, sleeping patterns, and whether you can feel the baby move.  Review warning signs to watch for and signs of labor.  Ask about any pregnancy symptoms you are having and how you are dealing with them. Symptoms may include: ? Headaches. ? Nausea and vomiting. ? Vaginal discharge. ? Swelling. ? Fatigue. ? Constipation. ? Any discomfort, including back or pelvic pain. Make a list of questions to ask your health care provider at your routine visits. What tests might I have during prenatal care visits? You may have blood, urine, and imaging tests throughout your pregnancy, such as:  Urine tests to check for glucose, protein, or signs of infection.  Glucose tests to check for a form of diabetes that can develop during pregnancy (gestational diabetes mellitus). This is  usually done around week 24 of pregnancy.  An ultrasound to check your baby's growth and development and to check for birth defects. This is usually done around week 20 of pregnancy.  A test to check for group B strep (GBS) infection. This is usually done around week 36 of pregnancy.  Genetic testing. This may include blood or imaging tests, such as an ultrasound. Some genetic tests are done during the first trimester and some are done during the second trimester. What else can I expect during prenatal care visits? Your health care provider may recommend getting certain vaccines during pregnancy. These may include:  A yearly flu shot (annual influenza vaccine). This is especially important if you will be pregnant during flu season.  Tdap (tetanus, diphtheria, pertussis) vaccine. Getting this vaccine during pregnancy can protect your baby from whooping cough (pertussis) after birth. This vaccine may be recommended between weeks 27 and 36 of pregnancy. Later in your pregnancy, your health care provider may give you information about:  Childbirth and breastfeeding classes.  Choosing a health care provider for your baby.  Umbilical cord banking.  Breastfeeding.  Birth control after your baby is born.  The hospital labor and delivery unit and how to tour it.  Registering at the hospital before you go into labor. Where to find more information  Office on Women's Health: TravelLesson.ca  American Pregnancy Association: americanpregnancy.org  March of Dimes: marchofdimes.org Summary  Prenatal care helps you and your baby stay as healthy as possible during pregnancy.  Your first prenatal care visit will most likely be the longest.  You will have visits and tests throughout your pregnancy to monitor your health and your baby's health.  Bring a list of questions to your visits to ask your health care provider.  Make sure to keep all follow-up and prenatal care visits with your  health care provider. This information is not intended to replace advice given to you by your health care provider. Make sure you discuss any questions you have with your health care provider. Document Released: 08/20/2003 Document Revised: 08/16/2017 Document Reviewed: 08/16/2017 Elsevier Interactive Patient Education  2019 [employer].    COVID-19 and Your Pregnancy FAQ  How can I prevent infection with COVID-19 during my pregnancy? Social distancing is key. Please limit any interactions in public. Try  and work from home if possible. Frequently wash your hands after touching possibly contaminated surfaces. Avoid touching your face.  Minimize trips to the store. Consider online ordering when possible.   Should I wear a mask? Masks should only be worn by those experiencing symptoms of COVID-19 or those with confirmed COVID-19 when they are in public or around other individuals.  What are the symptoms of COVID-19? Fever (greater than 100.4 F), dry cough, shortness of breath.  Am I more at risk for COVID-19 since I am pregnant? There is not currently data showing that pregnant women are more adversely impacted by COVID-19 than the general population. However, we know that pregnant women tend to have worse respiratory complications from similar diseases such as the flu and SARS and for this reason should be considered an at-risk population.  What do I do if I am experiencing the symptoms of COVID-19? Testing is being limited because of test availability. If you are experiencing symptoms you should quarantine yourself, and the members of your family, for at least 2 weeks at home.   Please visit this website for more information: DiscoHelp.si.html  When should I go to the Emergency Room? Please go to the emergency room if you are experiencing ANY of these symptoms*:  1.    Difficulty breathing or shortness of breath 2.     Persistent pain or pressure in the chest 3.    Confusion or difficulty being aroused (or awakened) 4.    Bluish lips or face  *This list is not all inclusive. Please consult our office for any other symptoms that are severe or concerning.  What do I do if I am having difficulty breathing? You should go to the Emergency Room for evaluation. At this time they have a tent set up for evaluating patients with COVID-19 symptoms.   How will my prenatal care be different because of the COVID-19 pandemic? It has been recommended to reduce the frequency of face-to-face visits and use resources such as telephone and virtual visits when possible. Using a scale, blood pressure machine and fetal doppler at home can further help reduce face-to-face visits. You will be provided with additional information on this topic.  We ask that you come to your visits alone to minimize potential exposures to  COVID-19.  How can I receive childbirth education? At this time in-person classes have been cancelled. You can register for online childbirth education, breastfeeding, and newborn care classes.  Please visit:  [email or web address] for more information  How will my hospital birth experience be different? The hospital is currently limiting visitors. This means that while you are in labor you can only have one person at the hospital with you. Additional family members will not be allowed to wait in the building or outside your room. Your one support person can be the father of the baby, a relative, a doula, or a friend. Once one support person is designated that person will wear a band. This band cannot be shared with multiple people.  How long will I stay in the hospital for after giving birth? It is also recommended that discharge home be expedited during the COVID-19 outbreak. This means staying for 1 day after a vaginal delivery and 2 days after a cesarean section.  What if I have COVID-19 and I am in  labor? We ask that you wear a mask while on labor and delivery. We will try and accommodate you being placed in a room that is capable of filtering  the air. Please call ahead if you are in labor and on your way to the hospital. The phone number for labor and delivery at [facility] is [phone].  If I have COVID-19 when my baby is born how can I prevent my baby from contracting COVID-19? This is an issue that will have to be discussed on a case-by-case basis. Current recommendations suggest providing separate isolation rooms for both the mother and new infant as well as limiting visitors. However, there are practical challenges to this recommendation. The situation will assuredly change and decisions will be influenced by the desires of the mother and availability of space.  Some suggestions are the use of a curtain or physical barrier between mom and infant, hand hygiene, mom wearing a mask, or 6 feet of spacing between a mom and infant.   Can I breastfeed during the COVID-19 pandemic?   Yes, breastfeeding is encouraged.  Can I breastfeed if I have COVID-19? Yes. Covid-19 has not been found in breast milk. This means you cannot give COVID-19 to your child through breast milk. Breast feeding will also help pass antibodies to fight infection to your baby.   What precautions should I take when breastfeeding if I have COVID-19? If a mother and newborn do room-in and the mother wishes to feed at the breast, she should put on a facemask and practice hand hygiene before each feeding.  What precautions should I take when pumping if I have COVID-19? Prior to expressing breast milk, mothers should practice hand hygiene. After each pumping session, all parts that come into contact with breast milk should be thoroughly washed and the entire pump should be appropriately disinfected per the manufacturers instructions. This expressed breast milk should be fed to the newborn by a healthy  caregiver.  What if I am pregnant and work in healthcare? Based on limited data regarding COVID-19 and pregnancy, ACOG currently does not propose creating additional restrictions on pregnant health care personnel because of COVID-19 alone. Pregnant women do not appear to be at higher risk of severe disease related to COVID-19. Pregnant health care personnel should follow CDC risk assessment and infection control guidelines for health care personnel exposed to patients with suspected or confirmed COVID-19. Adherence to recommended infection prevention and control practices is an important part of protecting all health care personnel in health care settings.    Information on COVID-19 in pregnancy is very limited; however, facilities may want to consider limiting exposure of pregnant health care personnel to patients with confirmed or suspected COVID-19 infection, especially during higher-risk procedures (eg, aerosol-generating procedures), if feasible, based on staffing availability.   Second Trimester of Pregnancy The second trimester is from week 14 through week 27 (months 4 through 6). The second trimester is often a time when you feel your best. Your body has adjusted to being pregnant, and you begin to feel better physically. Usually, morning sickness has lessened or quit completely, you may have more energy, and you may have an increase in appetite. The second trimester is also a time when the fetus is growing rapidly. At the end of the sixth month, the fetus is about 9 inches long and weighs about 1 pounds. You will likely begin to feel the baby move (quickening) between 16 and 20 weeks of pregnancy. Body changes during your second trimester Your body continues to go through many changes during your second trimester. The changes vary from woman to woman.  Your weight will continue to increase.  You will notice your lower abdomen bulging out.  You may begin to get stretch marks on your hips,  abdomen, and breasts.  You may develop headaches that can be relieved by medicines. The medicines should be approved by your health care provider.  You may urinate more often because the fetus is pressing on your bladder.  You may develop or continue to have heartburn as a result of your pregnancy.  You may develop constipation because certain hormones are causing the muscles that push waste through your intestines to slow down.  You may develop hemorrhoids or swollen, bulging veins (varicose veins).  You may have back pain. This is caused by: ? Weight gain. ? Pregnancy hormones that are relaxing the joints in your pelvis. ? A shift in weight and the muscles that support your balance.  Your breasts will continue to grow and they will continue to become tender.  Your gums may bleed and may be sensitive to brushing and flossing.  Dark spots or blotches (chloasma, mask of pregnancy) may develop on your face. This will likely fade after the baby is born.  A dark line from your belly button to the pubic area (linea nigra) may appear. This will likely fade after the baby is born.  You may have changes in your hair. These can include thickening of your hair, rapid growth, and changes in texture. Some women also have hair loss during or after pregnancy, or hair that feels dry or thin. Your hair will most likely return to normal after your baby is born. What to expect at prenatal visits During a routine prenatal visit:  You will be weighed to make sure you and the fetus are growing normally.  Your blood pressure will be taken.  Your abdomen will be measured to track your baby's growth.  The fetal heartbeat will be listened to.  Any test results from the previous visit will be discussed. Your health care provider may ask you:  How you are feeling.  If you are feeling the baby move.  If you have had any abnormal symptoms, such as leaking fluid, bleeding, severe headaches, or  abdominal cramping.  If you are using any tobacco products, including cigarettes, chewing tobacco, and electronic cigarettes.  If you have any questions. Other tests that may be performed during your second trimester include:  Blood tests that check for: ? Low iron levels (anemia). ? High blood sugar that affects pregnant women (gestational diabetes) between 50 and 28 weeks. ? Rh antibodies. This is to check for a protein on red blood cells (Rh factor).  Urine tests to check for infections, diabetes, or protein in the urine.  An ultrasound to confirm the proper growth and development of the baby.  An amniocentesis to check for possible genetic problems.  Fetal screens for spina bifida and Down syndrome.  HIV (human immunodeficiency virus) testing. Routine prenatal testing includes screening for HIV, unless you choose not to have this test. Follow these instructions at home: Medicines  Follow your health care provider's instructions regarding medicine use. Specific medicines may be either safe or unsafe to take during pregnancy.  Take a prenatal vitamin that contains at least 600 micrograms (mcg) of folic acid.  If you develop constipation, try taking a stool softener if your health care provider approves. Eating and drinking   Eat a balanced diet that includes fresh fruits and vegetables, whole grains, good sources of protein such as meat, eggs, or tofu, and low-fat dairy. Your health care  provider will help you determine the amount of weight gain that is right for you.  Avoid raw meat and uncooked cheese. These carry germs that can cause birth defects in the baby.  If you have low calcium intake from food, talk to your health care provider about whether you should take a daily calcium supplement.  Limit foods that are high in fat and processed sugars, such as fried and sweet foods.  To prevent constipation: ? Drink enough fluid to keep your urine clear or pale yellow. ? Eat  foods that are high in fiber, such as fresh fruits and vegetables, whole grains, and beans. Activity  Exercise only as directed by your health care provider. Most women can continue their usual exercise routine during pregnancy. Try to exercise for 30 minutes at least 5 days a week. Stop exercising if you experience uterine contractions.  Avoid heavy lifting, wear low heel shoes, and practice good posture.  A sexual relationship may be continued unless your health care provider directs you otherwise. Relieving pain and discomfort  Wear a good support bra to prevent discomfort from breast tenderness.  Take warm sitz baths to soothe any pain or discomfort caused by hemorrhoids. Use hemorrhoid cream if your health care provider approves.  Rest with your legs elevated if you have leg cramps or low back pain.  If you develop varicose veins, wear support hose. Elevate your feet for 15 minutes, 3-4 times a day. Limit salt in your diet. Prenatal Care  Write down your questions. Take them to your prenatal visits.  Keep all your prenatal visits as told by your health care provider. This is important. Safety  Wear your seat belt at all times when driving.  Make a list of emergency phone numbers, including numbers for family, friends, the hospital, and police and fire departments. General instructions  Ask your health care provider for a referral to a local prenatal education class. Begin classes no later than the beginning of month 6 of your pregnancy.  Ask for help if you have counseling or nutritional needs during pregnancy. Your health care provider can offer advice or refer you to specialists for help with various needs.  Do not use hot tubs, steam rooms, or saunas.  Do not douche or use tampons or scented sanitary pads.  Do not cross your legs for long periods of time.  Avoid cat litter boxes and soil used by cats. These carry germs that can cause birth defects in the baby and  possibly loss of the fetus by miscarriage or stillbirth.  Avoid all smoking, herbs, alcohol, and unprescribed drugs. Chemicals in these products can affect the formation and growth of the baby.  Do not use any products that contain nicotine or tobacco, such as cigarettes and e-cigarettes. If you need help quitting, ask your health care provider.  Visit your dentist if you have not gone yet during your pregnancy. Use a soft toothbrush to brush your teeth and be gentle when you floss. Contact a health care provider if:  You have dizziness.  You have mild pelvic cramps, pelvic pressure, or nagging pain in the abdominal area.  You have persistent nausea, vomiting, or diarrhea.  You have a bad smelling vaginal discharge.  You have pain when you urinate. Get help right away if:  You have a fever.  You are leaking fluid from your vagina.  You have spotting or bleeding from your vagina.  You have severe abdominal cramping or pain.  You have rapid  weight gain or weight loss.  You have shortness of breath with chest pain.  You notice sudden or extreme swelling of your face, hands, ankles, feet, or legs.  You have not felt your baby move in over an hour.  You have severe headaches that do not go away when you take medicine.  You have vision changes. Summary  The second trimester is from week 14 through week 27 (months 4 through 6). It is also a time when the fetus is growing rapidly.  Your body goes through many changes during pregnancy. The changes vary from woman to woman.  Avoid all smoking, herbs, alcohol, and unprescribed drugs. These chemicals affect the formation and growth your baby.  Do not use any tobacco products, such as cigarettes, chewing tobacco, and e-cigarettes. If you need help quitting, ask your health care provider.  Contact your health care provider if you have any questions. Keep all prenatal visits as told by your health care provider. This is  important. This information is not intended to replace advice given to you by your health care provider. Make sure you discuss any questions you have with your health care provider. Document Released: 08/11/2001 Document Revised: 09/22/2016 Document Reviewed: 09/22/2016 Elsevier Interactive Patient Education  2019 [employer].

## 2018-11-30 NOTE — Progress Notes (Signed)
Routine Prenatal Care Visit  Subjective  Natasha Alvarez is a 28 y.o. G1P0 at [redacted]w[redacted]d being seen today for ongoing prenatal care.  She is currently monitored for the following issues for this low-risk pregnancy and has Supervision of normal pregnancy, antepartum and Tenosynovitis of wrist on their problem list.  ----------------------------------------------------------------------------------- Patient reports nausea with vomiting some days. She requests anti-nausea medication. Patient has been taking Prometrium 200 mg capsules  following a course of injectable Prometrium early in the pregnancy due to starting the abortion process and then changing her mind. She is wondering if she needs to continue taking the Prometrium and if she does she prefers the injectable form since the capsule makes her sleepy at her job. Discussed with Dr Jean Rosenthal and he recommends that at 14 weeks it is ok to discontinue.  Dating scan was done at [facility] 10/10/2018     . Vag. Bleeding: None.   . Denies leaking of fluid.  ----------------------------------------------------------------------------------- The following portions of the patient's history were reviewed and updated as appropriate: allergies, current medications, past family history, past medical history, past social history, past surgical history and problem list. Problem list updated.   Objective  Blood pressure 122/70, weight 232 lb (105.2 kg), last menstrual period 08/14/2018. Pregravid weight 215 lb (97.5 kg) Total Weight Gain 17 lb (7.711 kg) Urinalysis: Urine Protein    Urine Glucose    Fetal Status: Fetal Heart Rate (bpm): 161         General:  Alert, oriented and cooperative. Patient is in no acute distress.  Skin: Skin is warm and dry. No rash noted.   Cardiovascular: Normal heart rate noted  Respiratory: Normal respiratory effort, no problems with respiration noted  Abdomen: Soft, gravid, appropriate for gestational age.       Pelvic:  Cervical  exam deferred        Extremities: Normal range of motion.     Mental Status: Normal mood and affect. Normal behavior. Normal judgment and thought content.   Assessment   28 y.o. G1P0 at [redacted]w[redacted]d by  05/31/2019, by Ultrasound presenting for routine prenatal visit  Plan   Pregnancy #1 Problems (from 11/09/18 to present)    Problem Noted Resolved   Supervision of normal pregnancy, antepartum 11/15/2018 by Oswaldo Conroy, CNM No   Overview Signed 11/15/2018  6:27 AM by Oswaldo Conroy, CNM    Clinic [facility] Prenatal Labs  Dating  Blood type:     Genetic Screen 1 Screen:    AFP:     Quad:     NIPS: Antibody:   Anatomic [place]  Rubella:   Varicella:    GTT Early:               Third trimester:  RPR:     Rhogam  HBsAg:     TDaP vaccine                       Flu Shot: HIV:     Baby Food                                GBS:   Contraception  Pap:  CBB     CS/VBAC    Support Person                  Preterm labor symptoms and general obstetric precautions including but not limited to vaginal bleeding, contractions, leaking of  fluid and fetal movement were reviewed in detail with the patient.  Discontinue Prometrium Rx Diclegis  Please refer to After Visit Summary for other counseling recommendations.   Return in about 4 weeks (around 12/28/2018) for anatomy scan and rob.  [name], CNM 11/30/2018 12:08 PM

## 2018-12-02 LAB — HEMOGLOBINOPATHY EVALUATION
HGB C: 33.6 % — ABNORMAL HIGH
HGB S: 0 %
HGB VARIANT: 0 %
Hemoglobin A2 Quantitation: 3.7 % — ABNORMAL HIGH (ref 1.8–3.2)
Hemoglobin F Quantitation: 0 % (ref 0.0–2.0)
Hgb A: 62.7 % — ABNORMAL LOW (ref 96.4–98.8)

## 2018-12-02 LAB — RPR+RH+ABO+RUB AB+AB SCR+CB...
Antibody Screen: NEGATIVE
HIV Screen 4th Generation wRfx: NONREACTIVE
Hematocrit: 39.7 % (ref 34.0–46.6)
Hemoglobin: 13.3 g/dL (ref 11.1–15.9)
Hepatitis B Surface Ag: NEGATIVE
MCH: 27.9 pg (ref 26.6–33.0)
MCHC: 33.5 g/dL (ref 31.5–35.7)
MCV: 83 fL (ref 79–97)
Platelets: 203 10*3/uL (ref 150–450)
RBC: 4.77 x10E6/uL (ref 3.77–5.28)
RDW: 14.3 % (ref 11.7–15.4)
RPR Ser Ql: NONREACTIVE
Rh Factor: POSITIVE
Rubella Antibodies, IGG: 4.26 index (ref >0.99)
Varicella zoster IgG: 811 index (ref >165)
WBC: 5.8 10*3/uL (ref 3.4–10.8)

## 2018-12-02 LAB — GLUCOSE, 1 HOUR GESTATIONAL: Gestational Diabetes Screen: 83 mg/dL (ref 65–139)

## 2018-12-04 LAB — MATERNIT 21 PLUS CORE, BLOOD
Fetal Fraction: 9
Result (T21): NEGATIVE
Trisomy 13 (Patau syndrome): NEGATIVE
Trisomy 18 (Edwards syndrome): NEGATIVE
Trisomy 21 (Down syndrome): NEGATIVE

## 2018-12-20 ENCOUNTER — Telehealth: Payer: Self-pay

## 2018-12-20 NOTE — Telephone Encounter (Signed)
Spoke w/patient. She states she had eaten some fruit and got sick (vomitted) about an hour ago. Denies fever, dysuria. States she is drinking water. Notified can see this pm to check UA, but cramping likely d/t vomiting episode within past hour. Advised to hydrate & have bland diet & monitor for dehydration (not keeping food/fluids down for 24 hours). Patient chose to hold off on appointment & hydrate/monitor symptoms. She will contact us in the morning for appointment if pain/s&s continue.

## 2018-12-20 NOTE — Telephone Encounter (Signed)
Patient states she is almost 4 mos pregnant. She is having lower abdominal cramping & mid to lower back pain. Inquiring if needs to be seen. [phone]

## 2018-12-28 ENCOUNTER — Other Ambulatory Visit: Payer: Self-pay

## 2018-12-28 ENCOUNTER — Ambulatory Visit (INDEPENDENT_AMBULATORY_CARE_PROVIDER_SITE_OTHER): Payer: BLUE CROSS/BLUE SHIELD | Admitting: Certified Nurse Midwife

## 2018-12-28 ENCOUNTER — Ambulatory Visit (INDEPENDENT_AMBULATORY_CARE_PROVIDER_SITE_OTHER): Payer: BLUE CROSS/BLUE SHIELD

## 2018-12-28 VITALS — BP 100/60 | Wt 239.0 lb

## 2018-12-28 DIAGNOSIS — Z3A18 18 weeks gestation of pregnancy: Secondary | ICD-10-CM

## 2018-12-28 DIAGNOSIS — D582 Other hemoglobinopathies: Secondary | ICD-10-CM

## 2018-12-28 DIAGNOSIS — O9989 Other specified diseases and conditions complicating pregnancy, childbirth and the puerperium: Secondary | ICD-10-CM

## 2018-12-28 DIAGNOSIS — Z3400 Encounter for supervision of normal first pregnancy, unspecified trimester: Secondary | ICD-10-CM

## 2018-12-28 DIAGNOSIS — Z363 Encounter for antenatal screening for malformations: Secondary | ICD-10-CM | POA: Diagnosis not present

## 2018-12-28 DIAGNOSIS — O99210 Obesity complicating pregnancy, unspecified trimester: Secondary | ICD-10-CM

## 2018-12-28 NOTE — Progress Notes (Signed)
ROB/anatomy scan at [redacted]wk gestation: CGA 18wk3d, anterior placenta, normal but incomplete anatomy scan. Need spine views Reports feeling flutters.  Lab results reviewed: A POS, diploid MaterniT 21(gender is a surprise), Hemoglobin C trait (AC), one hour GTT 83 Discussed significance of hemoglobin C trait and recommendation to test FOB for hemoglobinopathy in the next 4-8 weeks FU anatomy scan and ROB in 4 weeks.  Farrel Conners, CNM

## 2018-12-28 NOTE — Progress Notes (Signed)
ROB/Anatomy Scan- no concerns 

## 2019-02-01 ENCOUNTER — Encounter: Payer: Self-pay | Admitting: Maternal Newborn

## 2019-02-01 ENCOUNTER — Other Ambulatory Visit: Payer: Self-pay

## 2019-02-01 ENCOUNTER — Ambulatory Visit (INDEPENDENT_AMBULATORY_CARE_PROVIDER_SITE_OTHER): Payer: BLUE CROSS/BLUE SHIELD | Admitting: Maternal Newborn

## 2019-02-01 ENCOUNTER — Ambulatory Visit (INDEPENDENT_AMBULATORY_CARE_PROVIDER_SITE_OTHER): Payer: BLUE CROSS/BLUE SHIELD

## 2019-02-01 VITALS — BP 130/80 | Wt 257.0 lb

## 2019-02-01 DIAGNOSIS — Z3400 Encounter for supervision of normal first pregnancy, unspecified trimester: Secondary | ICD-10-CM

## 2019-02-01 DIAGNOSIS — O26892 Other specified pregnancy related conditions, second trimester: Secondary | ICD-10-CM

## 2019-02-01 DIAGNOSIS — O26899 Other specified pregnancy related conditions, unspecified trimester: Secondary | ICD-10-CM

## 2019-02-01 DIAGNOSIS — Z3A23 23 weeks gestation of pregnancy: Secondary | ICD-10-CM

## 2019-02-01 DIAGNOSIS — Z362 Encounter for other antenatal screening follow-up: Secondary | ICD-10-CM

## 2019-02-01 DIAGNOSIS — R12 Heartburn: Secondary | ICD-10-CM

## 2019-02-01 MED ORDER — FAMOTIDINE 20 MG PO TABS: 20.0000 mg | ORAL_TABLET | Freq: Two times a day (BID) | ORAL | 3 refills | Status: DC

## 2019-02-01 NOTE — Patient Instructions (Signed)

## 2019-02-01 NOTE — Progress Notes (Signed)
Routine Prenatal Care Visit  Subjective  Natasha Alvarez is a 28 y.o. G1P0 at 919w0d being seen today for ongoing prenatal care.  She is currently monitored for the following issues for this low-risk pregnancy and has Supervision of normal pregnancy, antepartum; Tenosynovitis of wrist; Hemoglobin C trait (HCC); and Obesity in pregnancy on their problem list.  ----------------------------------------------------------------------------------- Patient reports pain in her right hip, especially when sleeping on that side or sitting in one position for a while. Also has heartburn with acid reflux occasionally. Trying to avoid foods that cause reflux. Contractions: Not present. Vag. Bleeding: None.  Movement: Present. No leaking of fluid.  ----------------------------------------------------------------------------------- The following portions of the patient's history were reviewed and updated as appropriate: allergies, current medications, past family history, past medical history, past social history, past surgical history and problem list. Problem list updated.  Objective  Blood pressure 130/80, weight 257 lb (116.6 kg), last menstrual period 08/14/2018. Pregravid weight 215 lb (97.5 kg) Total Weight Gain 42 lb (19.1 kg)  Fetal Status: Fetal Heart Rate (bpm): 145   Movement: Present     General:  Alert, oriented and cooperative. Patient is in no acute distress.  Skin: Skin is warm and dry. No rash noted.   Cardiovascular: Normal heart rate noted  Respiratory: Normal respiratory effort, no problems with respiration noted  Abdomen: Soft, gravid, appropriate for gestational age. Pain/Pressure: Present     Pelvic:  Cervical exam deferred        Extremities: Normal range of motion.     Mental Status: Normal mood and affect. Normal behavior. Normal judgment and thought content.    Assessment   28 y.o. G1P0 at 4919w0d, EDD 05/31/2019 by Ultrasound presenting for a routine prenatal visit.  Plan    Pregnancy #1 Problems (from 11/09/18 to present)    Problem Noted Resolved   Supervision of normal pregnancy, antepartum 11/15/2018 by Oswaldo ConroySchmid, Shila Kruczek Y, CNM No   Overview Addendum 02/01/2019 11:04 AM by Oswaldo ConroySchmid, Kawanna Christley Y, CNM    [facility] Prenatal Labs  Dating Ultrasound at 3030w5d Blood type: A/Positive/-- (04/01 1210)   Genetic Screen NIPS: Negative XY Antibody:Negative (04/01 1210)  Anatomic US Complete 02/01/2019 Rubella: 4.26 (04/01 1210) Varicella: Immune  GTT Early: N/A             Third trimester:  RPR: Non Reactive (04/01 1210)   Rhogam N/A HBsAg: Negative (04/01 1210)   TDaP vaccine                       Flu Shot: HIV: Non Reactive (04/01 1210)   Baby Food                                GBS:   Contraception  Pap: 11/09/2018, NILM  CBB     CS/VBAC    Support Person               Anatomy scan complete and normal today.  Discussed measures to help with hip pain: stretches, frequent position changes, heating pad, warm soaks, Tylenol. Talked about availability of physical therapy referral if pain becomes persistent/severe.  Rx for Pepcid for acid reflux/heartburn.  Questions about waterbirth: advised about [facility]'s current policy not to allow any waterbirths, and that no provider here is able to offer waterbirth until trained.  Please refer to After Visit Summary for other counseling recommendations.   Return in about 4 weeks (  around 03/01/2019) for ROB with GTT/28 week labs.  Marcelyn Bruins, CNM 02/01/2019  11:10 AM

## 2019-03-01 ENCOUNTER — Other Ambulatory Visit: Payer: BC Managed Care – PPO

## 2019-03-01 ENCOUNTER — Ambulatory Visit (INDEPENDENT_AMBULATORY_CARE_PROVIDER_SITE_OTHER): Payer: BC Managed Care – PPO | Admitting: Advanced Practice Midwife

## 2019-03-01 ENCOUNTER — Other Ambulatory Visit: Payer: Self-pay

## 2019-03-01 ENCOUNTER — Encounter: Payer: Self-pay | Admitting: Advanced Practice Midwife

## 2019-03-01 VITALS — BP 124/78 | Wt 263.0 lb

## 2019-03-01 DIAGNOSIS — Z3A27 27 weeks gestation of pregnancy: Secondary | ICD-10-CM

## 2019-03-01 DIAGNOSIS — O26842 Uterine size-date discrepancy, second trimester: Secondary | ICD-10-CM

## 2019-03-01 DIAGNOSIS — O26849 Uterine size-date discrepancy, unspecified trimester: Secondary | ICD-10-CM

## 2019-03-01 DIAGNOSIS — Z3400 Encounter for supervision of normal first pregnancy, unspecified trimester: Secondary | ICD-10-CM | POA: Diagnosis not present

## 2019-03-01 NOTE — Patient Instructions (Signed)
Third Trimester of Pregnancy The third trimester is from week 28 through week 40 (months 7 through 9). The third trimester is a time when the unborn baby (fetus) is growing rapidly. At the end of the ninth month, the fetus is about 20 inches in length and weighs 6-10 pounds. Body changes during your third trimester Your body will continue to go through many changes during pregnancy. The changes vary from woman to woman. During the third trimester:  Your weight will continue to increase. You can expect to gain 25-35 pounds (11-16 kg) by the end of the pregnancy.  You may begin to get stretch marks on your hips, abdomen, and breasts.  You may urinate more often because the fetus is moving lower into your pelvis and pressing on your bladder.  You may develop or continue to have heartburn. This is caused by increased hormones that slow down muscles in the digestive tract.  You may develop or continue to have constipation because increased hormones slow digestion and cause the muscles that push waste through your intestines to relax.  You may develop hemorrhoids. These are swollen veins (varicose veins) in the rectum that can itch or be painful.  You may develop swollen, bulging veins (varicose veins) in your legs.  You may have increased body aches in the pelvis, back, or thighs. This is due to weight gain and increased hormones that are relaxing your joints.  You may have changes in your hair. These can include thickening of your hair, rapid growth, and changes in texture. Some women also have hair loss during or after pregnancy, or hair that feels dry or thin. Your hair will most likely return to normal after your baby is born.  Your breasts will continue to grow and they will continue to become tender. A yellow fluid (colostrum) may leak from your breasts. This is the first milk you are producing for your baby.  Your belly button may stick out.  You may notice more swelling in your hands,  face, or ankles.  You may have increased tingling or numbness in your hands, arms, and legs. The skin on your belly may also feel numb.  You may feel short of breath because of your expanding uterus.  You may have more problems sleeping. This can be caused by the size of your belly, increased need to urinate, and an increase in your body's metabolism.  You may notice the fetus "dropping," or moving lower in your abdomen (lightening).  You may have increased vaginal discharge.  You may notice your joints feel loose and you may have pain around your pelvic bone. What to expect at prenatal visits You will have prenatal exams every 2 weeks until week 36. Then you will have weekly prenatal exams. During a routine prenatal visit:  You will be weighed to make sure you and the baby are growing normally.  Your blood pressure will be taken.  Your abdomen will be measured to track your baby's growth.  The fetal heartbeat will be listened to.  Any test results from the previous visit will be discussed.  You may have a cervical check near your due date to see if your cervix has softened or thinned (effaced).  You will be tested for Group B streptococcus. This happens between 35 and 37 weeks. Your health care provider may ask you:  What your birth plan is.  How you are feeling.  If you are feeling the baby move.  If you have had any abnormal   symptoms, such as leaking fluid, bleeding, severe headaches, or abdominal cramping.  If you are using any tobacco products, including cigarettes, chewing tobacco, and electronic cigarettes.  If you have any questions. Other tests or screenings that may be performed during your third trimester include:  Blood tests that check for low iron levels (anemia).  Fetal testing to check the health, activity level, and growth of the fetus. Testing is done if you have certain medical conditions or if there are problems during the pregnancy.  Nonstress test  (NST). This test checks the health of your baby to make sure there are no signs of problems, such as the baby not getting enough oxygen. During this test, a belt is placed around your belly. The baby is made to move, and its heart rate is monitored during movement. What is false labor? False labor is a condition in which you feel small, irregular tightenings of the muscles in the womb (contractions) that usually go away with rest, changing position, or drinking water. These are called Braxton Hicks contractions. Contractions may last for hours, days, or even weeks before true labor sets in. If contractions come at regular intervals, become more frequent, increase in intensity, or become painful, you should see your health care provider. What are the signs of labor?  Abdominal cramps.  Regular contractions that start at 10 minutes apart and become stronger and more frequent with time.  Contractions that start on the top of the uterus and spread down to the lower abdomen and back.  Increased pelvic pressure and dull back pain.  A watery or bloody mucus discharge that comes from the vagina.  Leaking of amniotic fluid. This is also known as your "water breaking." It could be a slow trickle or a gush. Let your health care provider know if it has a color or strange odor. If you have any of these signs, call your health care provider right away, even if it is before your due date. Follow these instructions at home: Medicines  Follow your health care provider's instructions regarding medicine use. Specific medicines may be either safe or unsafe to take during pregnancy.  Take a prenatal vitamin that contains at least 600 micrograms (mcg) of folic acid.  If you develop constipation, try taking a stool softener if your health care provider approves. Eating and drinking   Eat a balanced diet that includes fresh fruits and vegetables, whole grains, good sources of protein such as meat, eggs, or tofu,  and low-fat dairy. Your health care provider will help you determine the amount of weight gain that is right for you.  Avoid raw meat and uncooked cheese. These carry germs that can cause birth defects in the baby.  If you have low calcium intake from food, talk to your health care provider about whether you should take a daily calcium supplement.  Eat four or five small meals rather than three large meals a day.  Limit foods that are high in fat and processed sugars, such as fried and sweet foods.  To prevent constipation: ? Drink enough fluid to keep your urine clear or pale yellow. ? Eat foods that are high in fiber, such as fresh fruits and vegetables, whole grains, and beans. Activity  Exercise only as directed by your health care provider. Most women can continue their usual exercise routine during pregnancy. Try to exercise for 30 minutes at least 5 days a week. Stop exercising if you experience uterine contractions.  Avoid heavy lifting.  Do   not exercise in extreme heat or humidity, or at high altitudes.  Wear low-heel, comfortable shoes.  Practice good posture.  You may continue to have sex unless your health care provider tells you otherwise. Relieving pain and discomfort  Take frequent breaks and rest with your legs elevated if you have leg cramps or low back pain.  Take warm sitz baths to soothe any pain or discomfort caused by hemorrhoids. Use hemorrhoid cream if your health care provider approves.  Wear a good support bra to prevent discomfort from breast tenderness.  If you develop varicose veins: ? Wear support pantyhose or compression stockings as told by your healthcare provider. ? Elevate your feet for 15 minutes, 3-4 times a day. Prenatal care  Write down your questions. Take them to your prenatal visits.  Keep all your prenatal visits as told by your health care provider. This is important. Safety  Wear your seat belt at all times when driving.  Make  a list of emergency phone numbers, including numbers for family, friends, the hospital, and police and fire departments. General instructions  Avoid cat litter boxes and soil used by cats. These carry germs that can cause birth defects in the baby. If you have a cat, ask someone to clean the litter box for you.  Do not travel far distances unless it is absolutely necessary and only with the approval of your health care provider.  Do not use hot tubs, steam rooms, or saunas.  Do not drink alcohol.  Do not use any products that contain nicotine or tobacco, such as cigarettes and e-cigarettes. If you need help quitting, ask your health care provider.  Do not use any medicinal herbs or unprescribed drugs. These chemicals affect the formation and growth of the baby.  Do not douche or use tampons or scented sanitary pads.  Do not cross your legs for long periods of time.  To prepare for the arrival of your baby: ? Take prenatal classes to understand, practice, and ask questions about labor and delivery. ? Make a trial run to the hospital. ? Visit the hospital and tour the maternity area. ? Arrange for maternity or paternity leave through employers. ? Arrange for family and friends to take care of pets while you are in the hospital. ? Purchase a rear-facing car seat and make sure you know how to install it in your car. ? Pack your hospital bag. ? Prepare the baby's nursery. Make sure to remove all pillows and stuffed animals from the baby's crib to prevent suffocation.  Visit your dentist if you have not gone during your pregnancy. Use a soft toothbrush to brush your teeth and be gentle when you floss. Contact a health care provider if:  You are unsure if you are in labor or if your water has broken.  You become dizzy.  You have mild pelvic cramps, pelvic pressure, or nagging pain in your abdominal area.  You have lower back pain.  You have persistent nausea, vomiting, or diarrhea.   You have an unusual or bad smelling vaginal discharge.  You have pain when you urinate. Get help right away if:  Your water breaks before 37 weeks.  You have regular contractions less than 5 minutes apart before 37 weeks.  You have a fever.  You are leaking fluid from your vagina.  You have spotting or bleeding from your vagina.  You have severe abdominal pain or cramping.  You have rapid weight loss or weight gain.  You have   shortness of breath with chest pain.  You notice sudden or extreme swelling of your face, hands, ankles, feet, or legs.  Your baby makes fewer than 10 movements in 2 hours.  You have severe headaches that do not go away when you take medicine.  You have vision changes. Summary  The third trimester is from week 28 through week 40, months 7 through 9. The third trimester is a time when the unborn baby (fetus) is growing rapidly.  During the third trimester, your discomfort may increase as you and your baby continue to gain weight. You may have abdominal, leg, and back pain, sleeping problems, and an increased need to urinate.  During the third trimester your breasts will keep growing and they will continue to become tender. A yellow fluid (colostrum) may leak from your breasts. This is the first milk you are producing for your baby.  False labor is a condition in which you feel small, irregular tightenings of the muscles in the womb (contractions) that eventually go away. These are called Braxton Hicks contractions. Contractions may last for hours, days, or even weeks before true labor sets in.  Signs of labor can include: abdominal cramps; regular contractions that start at 10 minutes apart and become stronger and more frequent with time; watery or bloody mucus discharge that comes from the vagina; increased pelvic pressure and dull back pain; and leaking of amniotic fluid. This information is not intended to replace advice given to you by your health  care provider. Make sure you discuss any questions you have with your health care provider. Document Released: 08/11/2001 Document Revised: 12/08/2018 Document Reviewed: 09/22/2016 Elsevier Patient Education  2020 [employer]  

## 2019-03-01 NOTE — Progress Notes (Signed)
Routine Prenatal Care Visit  Subjective  Natasha Alvarez is a 28 y.o. G1P0 at [redacted]w[redacted]d being seen today for ongoing prenatal care.  She is currently monitored for the following issues for this low-risk pregnancy and has Supervision of normal pregnancy, antepartum; Tenosynovitis of wrist; Hemoglobin C trait ([place]); and Obesity in pregnancy on their problem list.  ----------------------------------------------------------------------------------- Patient reports no complaints.  Discussed healthy diet and increased physical activity to minimize further weight gain during the pregnancy.  Contractions: Not present. Vag. Bleeding: None.  Movement: Present. Denies leaking of fluid.  ----------------------------------------------------------------------------------- The following portions of the patient's history were reviewed and updated as appropriate: allergies, current medications, past family history, past medical history, past social history, past surgical history and problem list. Problem list updated.   Objective  Blood pressure 124/78, weight 263 lb (119.3 kg), last menstrual period 08/14/2018. Pregravid weight 215 lb (97.5 kg) Total Weight Gain 48 lb (21.8 kg) Urinalysis: Urine Protein    Urine Glucose    Fetal Status: Fetal Heart Rate (bpm): 140 Fundal Height: 30 cm Movement: Present     General:  Alert, oriented and cooperative. Patient is in no acute distress.  Skin: Skin is warm and dry. No rash noted.   Cardiovascular: Normal heart rate noted  Respiratory: Normal respiratory effort, no problems with respiration noted  Abdomen: Soft, gravid, appropriate for gestational age. Pain/Pressure: Present     Pelvic:  Cervical exam deferred        Extremities: Normal range of motion.  Edema: Trace  Mental Status: Normal mood and affect. Normal behavior. Normal judgment and thought content.   Assessment   28 y.o. G1P0 at [redacted]w[redacted]d by  05/31/2019, by Ultrasound presenting for routine prenatal visit   Plan   Pregnancy #1 Problems (from 11/09/18 to present)    Problem Noted Resolved   Supervision of normal pregnancy, antepartum 11/15/2018 by Rexene Agent, CNM No   Overview Addendum 02/01/2019 11:04 AM by Rexene Agent, [place] Prenatal Labs  Dating Ultrasound at [redacted]w[redacted]d Blood type: A/Positive/-- (04/01 1210)   Genetic Screen NIPS: Negative XY Antibody:Negative (04/01 1210)  Anatomic [place] Complete 02/01/2019 Rubella: 4.26 (04/01 1210) Varicella: Immune  GTT Early: N/A             Third trimester:  RPR: Non Reactive (04/01 1210)   Rhogam N/A HBsAg: Negative (04/01 1210)   TDaP vaccine                       Flu Shot: HIV: Non Reactive (04/01 1210)   Baby Food                                GBS:   Contraception  Pap: 11/09/2018, NILM  CBB     CS/VBAC    Support Person                  Preterm labor symptoms and general obstetric precautions including but not limited to vaginal bleeding, contractions, leaking of fluid and fetal movement were reviewed in detail with the patient. Please refer to After Visit Summary for other counseling recommendations.   Weight gain: encouraged decreased carbohydrates/sugar and increase in physical activity.  Growth scan at next visit for size greater than dates and excessive weight gain during pregnancy.  Return in about 2 weeks (around 03/15/2019) for growth scan and rob.  Rod Can, CNM 03/01/2019 10:42  AM

## 2019-03-01 NOTE — Progress Notes (Signed)
No bv. No lof. 28 week labs today.

## 2019-03-03 LAB — 28 WEEK RH+PANEL
Basophils Absolute: 0 10*3/uL (ref 0.0–0.2)
Basos: 0 %
EOS (ABSOLUTE): 0.1 10*3/uL (ref 0.0–0.4)
Eos: 1 %
Gestational Diabetes Screen: 91 mg/dL (ref 65–139)
HIV Screen 4th Generation wRfx: NONREACTIVE
Hematocrit: 35.1 % (ref 34.0–46.6)
Hemoglobin: 11.9 g/dL (ref 11.1–15.9)
Immature Grans (Abs): 0 10*3/uL (ref 0.0–0.1)
Immature Granulocytes: 1 %
Lymphocytes Absolute: 1.6 10*3/uL (ref 0.7–3.1)
Lymphs: 19 %
MCH: 28 pg (ref 26.6–33.0)
MCHC: 33.9 g/dL (ref 31.5–35.7)
MCV: 83 fL (ref 79–97)
Monocytes Absolute: 0.6 10*3/uL (ref 0.1–0.9)
Monocytes: 7 %
Neutrophils Absolute: 6.1 10*3/uL (ref 1.4–7.0)
Neutrophils: 72 %
Platelets: 178 10*3/uL (ref 150–450)
RBC: 4.25 x10E6/uL (ref 3.77–5.28)
RDW: 13.1 % (ref 11.7–15.4)
RPR Ser Ql: NONREACTIVE
WBC: 8.3 10*3/uL (ref 3.4–10.8)

## 2019-03-15 ENCOUNTER — Other Ambulatory Visit: Payer: Self-pay

## 2019-03-15 ENCOUNTER — Ambulatory Visit (INDEPENDENT_AMBULATORY_CARE_PROVIDER_SITE_OTHER): Payer: BC Managed Care – PPO | Admitting: Advanced Practice Midwife

## 2019-03-15 ENCOUNTER — Ambulatory Visit (INDEPENDENT_AMBULATORY_CARE_PROVIDER_SITE_OTHER): Payer: BC Managed Care – PPO

## 2019-03-15 ENCOUNTER — Encounter: Payer: Self-pay | Admitting: Advanced Practice Midwife

## 2019-03-15 VITALS — BP 128/84 | Wt 265.0 lb

## 2019-03-15 DIAGNOSIS — Z3400 Encounter for supervision of normal first pregnancy, unspecified trimester: Secondary | ICD-10-CM

## 2019-03-15 DIAGNOSIS — Z3A29 29 weeks gestation of pregnancy: Secondary | ICD-10-CM

## 2019-03-15 DIAGNOSIS — Z362 Encounter for other antenatal screening follow-up: Secondary | ICD-10-CM

## 2019-03-15 DIAGNOSIS — O99213 Obesity complicating pregnancy, third trimester: Secondary | ICD-10-CM

## 2019-03-15 DIAGNOSIS — O26849 Uterine size-date discrepancy, unspecified trimester: Secondary | ICD-10-CM

## 2019-03-15 NOTE — Progress Notes (Addendum)
Routine Prenatal Care Visit  Subjective  Natasha Alvarez is a 28 y.o. G1P0 at [redacted]w[redacted]d being seen today for ongoing prenatal care.  She is currently monitored for the following issues for this low-risk pregnancy and has Supervision of normal pregnancy, antepartum; Tenosynovitis of wrist; Hemoglobin C trait ([place]); and Obesity in pregnancy on their problem list.  ----------------------------------------------------------------------------------- Patient reports some hip pain. She got a yoga ball which is helping. She has had numbness in her left hand radiating into her arm. She denies pain in her hand/arm. She admits better diet and increase in exercise in the past 2 weeks- less junk food/soda.   Contractions: Not present. Vag. Bleeding: None.  Movement: Present. Denies leaking of fluid.  ----------------------------------------------------------------------------------- The following portions of the patient's history were reviewed and updated as appropriate: allergies, current medications, past family history, past medical history, past social history, past surgical history and problem list. Problem list updated.   Objective  Blood pressure 128/84, weight 265 lb (120.2 kg), last menstrual period 08/14/2018. Pregravid weight 215 lb (97.5 kg) Total Weight Gain 50 lb (22.7 kg) Urinalysis: Urine Protein    Urine Glucose    Fetal Status: Fetal Heart Rate (bpm): 144 Fundal Height: 32 cm Movement: Present     Growth/AFI: 56.4%, 3 pounds 0 ounces, AFI 21.7 cm  General:  Alert, oriented and cooperative. Patient is in no acute distress.  Skin: Skin is warm and dry. No rash noted.   Cardiovascular: Normal heart rate noted  Respiratory: Normal respiratory effort, no problems with respiration noted  Abdomen: Soft, gravid, appropriate for gestational age. Pain/Pressure: Present     Pelvic:  Cervical exam deferred        Extremities: Normal range of motion.  Edema: None  Mental Status: Normal mood and  affect. Normal behavior. Normal judgment and thought content.   Assessment   28 y.o. G1P0 at [redacted]w[redacted]d by  05/31/2019, by Ultrasound presenting for routine prenatal visit  Plan   Pregnancy #1 Problems (from 11/09/18 to present)    Problem Noted Resolved   Supervision of normal pregnancy, antepartum 11/15/2018 by Rexene Agent, CNM No   Overview Addendum 02/01/2019 11:04 AM by Rexene Agent, [place] Prenatal Labs  Dating Ultrasound at [redacted]w[redacted]d Blood type: A/Positive/-- (04/01 1210)   Genetic Screen NIPS: Negative XY Antibody:Negative (04/01 1210)  Anatomic [place] Complete 02/01/2019 Rubella: 4.26 (04/01 1210) Varicella: Immune  GTT Early: N/A             Third trimester:  RPR: Non Reactive (04/01 1210)   Rhogam N/A HBsAg: Negative (04/01 1210)   TDaP vaccine                       Flu Shot: HIV: Non Reactive (04/01 1210)   Baby Food                                GBS:   Contraception  Pap: 11/09/2018, NILM  CBB     CS/VBAC    Support Person                  Preterm labor symptoms and general obstetric precautions including but not limited to vaginal bleeding, contractions, leaking of fluid and fetal movement were reviewed in detail with the patient. Please refer to After Visit Summary for other counseling recommendations.   Return in about 2 weeks (around 03/29/2019) for  rob.  Tresea MallJane Resean Brander, CNM 03/15/2019 11:11 AM

## 2019-03-15 NOTE — Progress Notes (Signed)
Growth scan today. No vb. No lof. 

## 2019-03-29 ENCOUNTER — Other Ambulatory Visit: Payer: Self-pay

## 2019-03-29 ENCOUNTER — Ambulatory Visit (INDEPENDENT_AMBULATORY_CARE_PROVIDER_SITE_OTHER): Payer: BC Managed Care – PPO | Admitting: Certified Nurse Midwife

## 2019-03-29 VITALS — BP 126/72 | Wt 273.0 lb

## 2019-03-29 DIAGNOSIS — O99213 Obesity complicating pregnancy, third trimester: Secondary | ICD-10-CM

## 2019-03-29 DIAGNOSIS — Z23 Encounter for immunization: Secondary | ICD-10-CM

## 2019-03-29 DIAGNOSIS — O163 Unspecified maternal hypertension, third trimester: Secondary | ICD-10-CM

## 2019-03-29 DIAGNOSIS — Z3A31 31 weeks gestation of pregnancy: Secondary | ICD-10-CM

## 2019-03-29 DIAGNOSIS — O99210 Obesity complicating pregnancy, unspecified trimester: Secondary | ICD-10-CM

## 2019-03-29 DIAGNOSIS — Z3400 Encounter for supervision of normal first pregnancy, unspecified trimester: Secondary | ICD-10-CM

## 2019-03-29 NOTE — Progress Notes (Signed)
ROB  No concerns Denies lof, no vb, Good FM 

## 2019-03-30 LAB — PROTEIN / CREATININE RATIO, URINE
Creatinine, Urine: 71.6 mg/dL
Protein, Ur: 12.1 mg/dL
Protein/Creat Ratio: 169 mg/g creat (ref 0–200)

## 2019-04-04 NOTE — Progress Notes (Signed)
ROB at 31 weeks: Has had increased edema in her feet and ankles, that usually decreases with elevation. Baby active.  No headaches, SOB, CP, RUQ pain .Weight up 8# in 2 weeks. +2 edema in feet/ankles. Initial blood pressure 140/70, and repeat 126/72. P/C ratio sent. 28 week labs WNL TDAP today, Blood transfusion consent  Will be breast and bottle feeding baby Brayden Given Ready, Set, Baby booklet  RTO in 1 week for BP check and growth scan Preeclampsia precautions Dalia Heading, CNM

## 2019-04-05 ENCOUNTER — Ambulatory Visit (INDEPENDENT_AMBULATORY_CARE_PROVIDER_SITE_OTHER): Payer: BC Managed Care – PPO | Admitting: Maternal Newborn

## 2019-04-05 ENCOUNTER — Ambulatory Visit (INDEPENDENT_AMBULATORY_CARE_PROVIDER_SITE_OTHER): Payer: BC Managed Care – PPO

## 2019-04-05 ENCOUNTER — Other Ambulatory Visit: Payer: Self-pay

## 2019-04-05 ENCOUNTER — Encounter: Payer: Self-pay | Admitting: Maternal Newborn

## 2019-04-05 VITALS — BP 120/60 | Wt 275.2 lb

## 2019-04-05 DIAGNOSIS — Z362 Encounter for other antenatal screening follow-up: Secondary | ICD-10-CM

## 2019-04-05 DIAGNOSIS — Z3A32 32 weeks gestation of pregnancy: Secondary | ICD-10-CM

## 2019-04-05 DIAGNOSIS — O99210 Obesity complicating pregnancy, unspecified trimester: Secondary | ICD-10-CM

## 2019-04-05 DIAGNOSIS — O163 Unspecified maternal hypertension, third trimester: Secondary | ICD-10-CM

## 2019-04-05 DIAGNOSIS — Z3400 Encounter for supervision of normal first pregnancy, unspecified trimester: Secondary | ICD-10-CM

## 2019-04-05 DIAGNOSIS — Z3403 Encounter for supervision of normal first pregnancy, third trimester: Secondary | ICD-10-CM

## 2019-04-05 NOTE — Progress Notes (Signed)
ROB and Growth scan- no concerns 

## 2019-04-05 NOTE — Patient Instructions (Signed)
Third Trimester of Pregnancy The third trimester is from week 28 through week 40 (months 7 through 9). The third trimester is a time when the unborn baby (fetus) is growing rapidly. At the end of the ninth month, the fetus is about 20 inches in length and weighs 6-10 pounds. Body changes during your third trimester Your body will continue to go through many changes during pregnancy. The changes vary from woman to woman. During the third trimester:  Your weight will continue to increase. You can expect to gain 25-35 pounds (11-16 kg) by the end of the pregnancy.  You may begin to get stretch marks on your hips, abdomen, and breasts.  You may urinate more often because the fetus is moving lower into your pelvis and pressing on your bladder.  You may develop or continue to have heartburn. This is caused by increased hormones that slow down muscles in the digestive tract.  You may develop or continue to have constipation because increased hormones slow digestion and cause the muscles that push waste through your intestines to relax.  You may develop hemorrhoids. These are swollen veins (varicose veins) in the rectum that can itch or be painful.  You may develop swollen, bulging veins (varicose veins) in your legs.  You may have increased body aches in the pelvis, back, or thighs. This is due to weight gain and increased hormones that are relaxing your joints.  You may have changes in your hair. These can include thickening of your hair, rapid growth, and changes in texture. Some women also have hair loss during or after pregnancy, or hair that feels dry or thin. Your hair will most likely return to normal after your baby is born.  Your breasts will continue to grow and they will continue to become tender. A yellow fluid (colostrum) may leak from your breasts. This is the first milk you are producing for your baby.  Your belly button may stick out.  You may notice more swelling in your hands,  face, or ankles.  You may have increased tingling or numbness in your hands, arms, and legs. The skin on your belly may also feel numb.  You may feel short of breath because of your expanding uterus.  You may have more problems sleeping. This can be caused by the size of your belly, increased need to urinate, and an increase in your body's metabolism.  You may notice the fetus "dropping," or moving lower in your abdomen (lightening).  You may have increased vaginal discharge.  You may notice your joints feel loose and you may have pain around your pelvic bone. What to expect at prenatal visits You will have prenatal exams every 2 weeks until week 36. Then you will have weekly prenatal exams. During a routine prenatal visit:  You will be weighed to make sure you and the baby are growing normally.  Your blood pressure will be taken.  Your abdomen will be measured to track your baby's growth.  The fetal heartbeat will be listened to.  Any test results from the previous visit will be discussed.  You may have a cervical check near your due date to see if your cervix has softened or thinned (effaced).  You will be tested for Group B streptococcus. This happens between 35 and 37 weeks. Your health care provider may ask you:  What your birth plan is.  How you are feeling.  If you are feeling the baby move.  If you have had any abnormal   symptoms, such as leaking fluid, bleeding, severe headaches, or abdominal cramping.  If you are using any tobacco products, including cigarettes, chewing tobacco, and electronic cigarettes.  If you have any questions. Other tests or screenings that may be performed during your third trimester include:  Blood tests that check for low iron levels (anemia).  Fetal testing to check the health, activity level, and growth of the fetus. Testing is done if you have certain medical conditions or if there are problems during the pregnancy.  Nonstress test  (NST). This test checks the health of your baby to make sure there are no signs of problems, such as the baby not getting enough oxygen. During this test, a belt is placed around your belly. The baby is made to move, and its heart rate is monitored during movement. What is false labor? False labor is a condition in which you feel small, irregular tightenings of the muscles in the womb (contractions) that usually go away with rest, changing position, or drinking water. These are called Braxton Hicks contractions. Contractions may last for hours, days, or even weeks before true labor sets in. If contractions come at regular intervals, become more frequent, increase in intensity, or become painful, you should see your health care provider. What are the signs of labor?  Abdominal cramps.  Regular contractions that start at 10 minutes apart and become stronger and more frequent with time.  Contractions that start on the top of the uterus and spread down to the lower abdomen and back.  Increased pelvic pressure and dull back pain.  A watery or bloody mucus discharge that comes from the vagina.  Leaking of amniotic fluid. This is also known as your "water breaking." It could be a slow trickle or a gush. Let your health care provider know if it has a color or strange odor. If you have any of these signs, call your health care provider right away, even if it is before your due date. Follow these instructions at home: Medicines  Follow your health care provider's instructions regarding medicine use. Specific medicines may be either safe or unsafe to take during pregnancy.  Take a prenatal vitamin that contains at least 600 micrograms (mcg) of folic acid.  If you develop constipation, try taking a stool softener if your health care provider approves. Eating and drinking   Eat a balanced diet that includes fresh fruits and vegetables, whole grains, good sources of protein such as meat, eggs, or tofu,  and low-fat dairy. Your health care provider will help you determine the amount of weight gain that is right for you.  Avoid raw meat and uncooked cheese. These carry germs that can cause birth defects in the baby.  If you have low calcium intake from food, talk to your health care provider about whether you should take a daily calcium supplement.  Eat four or five small meals rather than three large meals a day.  Limit foods that are high in fat and processed sugars, such as fried and sweet foods.  To prevent constipation: ? Drink enough fluid to keep your urine clear or pale yellow. ? Eat foods that are high in fiber, such as fresh fruits and vegetables, whole grains, and beans. Activity  Exercise only as directed by your health care provider. Most women can continue their usual exercise routine during pregnancy. Try to exercise for 30 minutes at least 5 days a week. Stop exercising if you experience uterine contractions.  Avoid heavy lifting.  Do   not exercise in extreme heat or humidity, or at high altitudes.  Wear low-heel, comfortable shoes.  Practice good posture.  You may continue to have sex unless your health care provider tells you otherwise. Relieving pain and discomfort  Take frequent breaks and rest with your legs elevated if you have leg cramps or low back pain.  Take warm sitz baths to soothe any pain or discomfort caused by hemorrhoids. Use hemorrhoid cream if your health care provider approves.  Wear a good support bra to prevent discomfort from breast tenderness.  If you develop varicose veins: ? Wear support pantyhose or compression stockings as told by your healthcare provider. ? Elevate your feet for 15 minutes, 3-4 times a day. Prenatal care  Write down your questions. Take them to your prenatal visits.  Keep all your prenatal visits as told by your health care provider. This is important. Safety  Wear your seat belt at all times when driving.  Make  a list of emergency phone numbers, including numbers for family, friends, the hospital, and police and fire departments. General instructions  Avoid cat litter boxes and soil used by cats. These carry germs that can cause birth defects in the baby. If you have a cat, ask someone to clean the litter box for you.  Do not travel far distances unless it is absolutely necessary and only with the approval of your health care provider.  Do not use hot tubs, steam rooms, or saunas.  Do not drink alcohol.  Do not use any products that contain nicotine or tobacco, such as cigarettes and e-cigarettes. If you need help quitting, ask your health care provider.  Do not use any medicinal herbs or unprescribed drugs. These chemicals affect the formation and growth of the baby.  Do not douche or use tampons or scented sanitary pads.  Do not cross your legs for long periods of time.  To prepare for the arrival of your baby: ? Take prenatal classes to understand, practice, and ask questions about labor and delivery. ? Make a trial run to the hospital. ? Visit the hospital and tour the maternity area. ? Arrange for maternity or paternity leave through employers. ? Arrange for family and friends to take care of pets while you are in the hospital. ? Purchase a rear-facing car seat and make sure you know how to install it in your car. ? Pack your hospital bag. ? Prepare the baby's nursery. Make sure to remove all pillows and stuffed animals from the baby's crib to prevent suffocation.  Visit your dentist if you have not gone during your pregnancy. Use a soft toothbrush to brush your teeth and be gentle when you floss. Contact a health care provider if:  You are unsure if you are in labor or if your water has broken.  You become dizzy.  You have mild pelvic cramps, pelvic pressure, or nagging pain in your abdominal area.  You have lower back pain.  You have persistent nausea, vomiting, or diarrhea.   You have an unusual or bad smelling vaginal discharge.  You have pain when you urinate. Get help right away if:  Your water breaks before 37 weeks.  You have regular contractions less than 5 minutes apart before 37 weeks.  You have a fever.  You are leaking fluid from your vagina.  You have spotting or bleeding from your vagina.  You have severe abdominal pain or cramping.  You have rapid weight loss or weight gain.  You have   shortness of breath with chest pain.  You notice sudden or extreme swelling of your face, hands, ankles, feet, or legs.  Your baby makes fewer than 10 movements in 2 hours.  You have severe headaches that do not go away when you take medicine.  You have vision changes. Summary  The third trimester is from week 28 through week 40, months 7 through 9. The third trimester is a time when the unborn baby (fetus) is growing rapidly.  During the third trimester, your discomfort may increase as you and your baby continue to gain weight. You may have abdominal, leg, and back pain, sleeping problems, and an increased need to urinate.  During the third trimester your breasts will keep growing and they will continue to become tender. A yellow fluid (colostrum) may leak from your breasts. This is the first milk you are producing for your baby.  False labor is a condition in which you feel small, irregular tightenings of the muscles in the womb (contractions) that eventually go away. These are called Braxton Hicks contractions. Contractions may last for hours, days, or even weeks before true labor sets in.  Signs of labor can include: abdominal cramps; regular contractions that start at 10 minutes apart and become stronger and more frequent with time; watery or bloody mucus discharge that comes from the vagina; increased pelvic pressure and dull back pain; and leaking of amniotic fluid. This information is not intended to replace advice given to you by your health  care provider. Make sure you discuss any questions you have with your health care provider. Document Released: 08/11/2001 Document Revised: 12/08/2018 Document Reviewed: 09/22/2016 Elsevier Patient Education  2020 [employer]  

## 2019-04-05 NOTE — Progress Notes (Signed)
    Routine Prenatal Care Visit  Subjective  Natasha Alvarez is a 28 y.o. G1P0 at 110w0d being seen today for ongoing prenatal care.  She is currently monitored for the following issues for this low-risk pregnancy and has Supervision of normal pregnancy, antepartum; Tenosynovitis of wrist; Hemoglobin C trait ([place]); and Obesity in pregnancy on their problem list.  ----------------------------------------------------------------------------------- Patient reports no complaints.  Not having headaches, visual changes, epigastric pain, or increase in edema. Contractions: Not present. Vag. Bleeding: None.  Movement: Present. No leaking of fluid.  ----------------------------------------------------------------------------------- The following portions of the patient's history were reviewed and updated as appropriate: allergies, current medications, past family history, past medical history, past social history, past surgical history and problem list. Problem list updated.  Objective  Blood pressure 120/60, weight 275 lb 3.2 oz (124.8 kg), last menstrual period 08/14/2018. Pregravid weight 215 lb (97.5 kg) Total Weight Gain 60 lb 3.2 oz (27.3 kg) Urinalysis: Urine dipstick shows negative for glucose, protein.  Fetal Status: Fetal Heart Rate (bpm): 133 ([place])   Movement: Present     General:  Alert, oriented and cooperative. Patient is in no acute distress.  Skin: Skin is warm and dry. No rash noted.   Cardiovascular: Normal heart rate noted  Respiratory: Normal respiratory effort, no problems with respiration noted  Abdomen: Soft, gravid, appropriate for gestational age. Pain/Pressure: Present     Pelvic:  Cervical exam deferred        Extremities: Normal range of motion.     Mental Status: Normal mood and affect. Normal behavior. Normal judgment and thought content.     Assessment   28 y.o. G1P0 at [redacted]w[redacted]d, EDD 05/31/2019 by Ultrasound presenting for a routine prenatal visit.  Plan    Pregnancy #1 Problems (from 11/09/18 to present)    Problem Noted Resolved   Supervision of normal pregnancy, antepartum 11/15/2018 by Rexene Agent, CNM No   Overview Addendum 02/01/2019 11:04 AM by Rexene Agent, [place] Prenatal Labs  Dating Ultrasound at [redacted]w[redacted]d Blood type: A/Positive/-- (04/01 1210)   Genetic Screen NIPS: Negative XY Antibody:Negative (04/01 1210)  Anatomic [place] Complete 02/01/2019 Rubella: 4.26 (04/01 1210) Varicella: Immune  GTT Early: N/A             Third trimester:  RPR: Non Reactive (04/01 1210)   Rhogam N/A HBsAg: Negative (04/01 1210)   TDaP vaccine                       Flu Shot: HIV: Non Reactive (04/01 1210)   Baby Food                                GBS:   Contraception  Pap: 11/09/2018, NILM  CBB     CS/VBAC    Support Person                 Growth scan today shows growth at 59.9%, EFW 4 lb 9 oz, AFI 50.9 cm, cephalic presentation, FHR 133 bpm. Results reviewed with patient.  Please refer to After Visit Summary for other counseling recommendations.   Return in about 2 weeks (around 04/19/2019) for [place].  Avel Sensor, CNM 04/05/2019  4:59 PM

## 2019-04-19 ENCOUNTER — Ambulatory Visit (INDEPENDENT_AMBULATORY_CARE_PROVIDER_SITE_OTHER): Payer: BC Managed Care – PPO | Admitting: Advanced Practice Midwife

## 2019-04-19 ENCOUNTER — Other Ambulatory Visit: Payer: Self-pay

## 2019-04-19 ENCOUNTER — Encounter: Payer: Self-pay | Admitting: Advanced Practice Midwife

## 2019-04-19 VITALS — BP 122/78 | Wt 279.0 lb

## 2019-04-19 DIAGNOSIS — Z3A34 34 weeks gestation of pregnancy: Secondary | ICD-10-CM

## 2019-04-19 DIAGNOSIS — Z3403 Encounter for supervision of normal first pregnancy, third trimester: Secondary | ICD-10-CM

## 2019-04-19 LAB — POCT URINALYSIS DIPSTICK OB
Glucose, UA: NEGATIVE
POC,PROTEIN,UA: NEGATIVE

## 2019-04-19 NOTE — Progress Notes (Signed)
  Routine Prenatal Care Visit  Subjective  Natasha Alvarez is a 28 y.o. G1P0 at [redacted]w[redacted]d being seen today for ongoing prenatal care.  She is currently monitored for the following issues for this low-risk pregnancy and has Supervision of normal pregnancy, antepartum; Tenosynovitis of wrist; Hemoglobin C trait ([place]); and Obesity in pregnancy on their problem list.  ----------------------------------------------------------------------------------- Patient reports lower extremity swelling. Reviewed comfort measures. Weight gain reviewed- encouraged eating less sweets and increasing exercise.   Contractions: Not present. Vag. Bleeding: None.  Movement: Present. Denies leaking of fluid.  ----------------------------------------------------------------------------------- The following portions of the patient's history were reviewed and updated as appropriate: allergies, current medications, past family history, past medical history, past social history, past surgical history and problem list. Problem list updated.   Objective  Blood pressure 122/78, weight 279 lb (126.6 kg), last menstrual period 08/14/2018. Pregravid weight 215 lb (97.5 kg) Total Weight Gain 64 lb (29 kg) Urinalysis: Urine Protein    Urine Glucose    Fetal Status: Fetal Heart Rate (bpm): 142 Fundal Height: 36 cm Movement: Present     General:  Alert, oriented and cooperative. Patient is in no acute distress.  Skin: Skin is warm and dry. No rash noted.   Cardiovascular: Normal heart rate noted  Respiratory: Normal respiratory effort, no problems with respiration noted  Abdomen: Soft, gravid, appropriate for gestational age. Pain/Pressure: Absent     Pelvic:  Cervical exam deferred        Extremities: Normal range of motion.  Edema: trace to +1  Mental Status: Normal mood and affect. Normal behavior. Normal judgment and thought content.   Assessment   28 y.o. G1P0 at [redacted]w[redacted]d by  05/31/2019, by Ultrasound presenting for routine  prenatal visit  Plan   Pregnancy #1 Problems (from 11/09/18 to present)    Problem Noted Resolved   Supervision of normal pregnancy, antepartum 11/15/2018 by Rexene Agent, CNM No   Overview Addendum 02/01/2019 11:04 AM by Rexene Agent, [place] Prenatal Labs  Dating Ultrasound at [redacted]w[redacted]d Blood type: A/Positive/-- (04/01 1210)   Genetic Screen NIPS: Negative XY Antibody:Negative (04/01 1210)  Anatomic [place] Complete 02/01/2019 Rubella: 4.26 (04/01 1210) Varicella: Immune  GTT Early: N/A             Third trimester:  RPR: Non Reactive (04/01 1210)   Rhogam N/A HBsAg: Negative (04/01 1210)   TDaP vaccine                       Flu Shot: HIV: Non Reactive (04/01 1210)   Baby Food                                GBS:   Contraception  Pap: 11/09/2018, NILM  CBB     CS/VBAC    Support Person                  Preterm labor symptoms and general obstetric precautions including but not limited to vaginal bleeding, contractions, leaking of fluid and fetal movement were reviewed in detail with the patient.   Return in about 2 weeks (around 05/03/2019) for rob.  Rod Can, CNM 04/19/2019 11:14 AM

## 2019-04-19 NOTE — Progress Notes (Signed)
No vb. No lof. Pt having some swelling.

## 2019-05-03 ENCOUNTER — Encounter: Payer: Self-pay | Admitting: Maternal Newborn

## 2019-05-03 ENCOUNTER — Ambulatory Visit (INDEPENDENT_AMBULATORY_CARE_PROVIDER_SITE_OTHER): Payer: BC Managed Care – PPO | Admitting: Maternal Newborn

## 2019-05-03 ENCOUNTER — Other Ambulatory Visit (HOSPITAL_COMMUNITY)
Admission: RE | Admit: 2019-05-03 | Discharge: 2019-05-03 | Disposition: A | Discharge status: Home / Self Care | Payer: Medicaid Other | Source: Ambulatory Visit | Attending: Maternal Newborn | Admitting: Maternal Newborn

## 2019-05-03 ENCOUNTER — Other Ambulatory Visit: Payer: Self-pay

## 2019-05-03 VITALS — BP 120/80 | Wt 280.0 lb

## 2019-05-03 DIAGNOSIS — Z3403 Encounter for supervision of normal first pregnancy, third trimester: Secondary | ICD-10-CM

## 2019-05-03 DIAGNOSIS — Z3400 Encounter for supervision of normal first pregnancy, unspecified trimester: Secondary | ICD-10-CM | POA: Diagnosis not present

## 2019-05-03 DIAGNOSIS — Z3A36 36 weeks gestation of pregnancy: Secondary | ICD-10-CM

## 2019-05-03 LAB — OB RESULTS CONSOLE GC/CHLAMYDIA: Gonorrhea: NEGATIVE

## 2019-05-03 LAB — OB RESULTS CONSOLE GBS: GBS: NEGATIVE

## 2019-05-03 NOTE — Progress Notes (Signed)
No problems.rj 

## 2019-05-03 NOTE — Patient Instructions (Signed)
Third Trimester of Pregnancy The third trimester is from week 28 through week 40 (months 7 through 9). The third trimester is a time when the unborn baby (fetus) is growing rapidly. At the end of the ninth month, the fetus is about 20 inches in length and weighs 6-10 pounds. Body changes during your third trimester Your body will continue to go through many changes during pregnancy. The changes vary from woman to woman. During the third trimester:  Your weight will continue to increase. You can expect to gain 25-35 pounds (11-16 kg) by the end of the pregnancy.  You may begin to get stretch marks on your hips, abdomen, and breasts.  You may urinate more often because the fetus is moving lower into your pelvis and pressing on your bladder.  You may develop or continue to have heartburn. This is caused by increased hormones that slow down muscles in the digestive tract.  You may develop or continue to have constipation because increased hormones slow digestion and cause the muscles that push waste through your intestines to relax.  You may develop hemorrhoids. These are swollen veins (varicose veins) in the rectum that can itch or be painful.  You may develop swollen, bulging veins (varicose veins) in your legs.  You may have increased body aches in the pelvis, back, or thighs. This is due to weight gain and increased hormones that are relaxing your joints.  You may have changes in your hair. These can include thickening of your hair, rapid growth, and changes in texture. Some women also have hair loss during or after pregnancy, or hair that feels dry or thin. Your hair will most likely return to normal after your baby is born.  Your breasts will continue to grow and they will continue to become tender. A yellow fluid (colostrum) may leak from your breasts. This is the first milk you are producing for your baby.  Your belly button may stick out.  You may notice more swelling in your hands,  face, or ankles.  You may have increased tingling or numbness in your hands, arms, and legs. The skin on your belly may also feel numb.  You may feel short of breath because of your expanding uterus.  You may have more problems sleeping. This can be caused by the size of your belly, increased need to urinate, and an increase in your body's metabolism.  You may notice the fetus "dropping," or moving lower in your abdomen (lightening).  You may have increased vaginal discharge.  You may notice your joints feel loose and you may have pain around your pelvic bone. What to expect at prenatal visits You will have prenatal exams every 2 weeks until week 36. Then you will have weekly prenatal exams. During a routine prenatal visit:  You will be weighed to make sure you and the baby are growing normally.  Your blood pressure will be taken.  Your abdomen will be measured to track your baby's growth.  The fetal heartbeat will be listened to.  Any test results from the previous visit will be discussed.  You may have a cervical check near your due date to see if your cervix has softened or thinned (effaced).  You will be tested for Group B streptococcus. This happens between 35 and 37 weeks. Your health care provider may ask you:  What your birth plan is.  How you are feeling.  If you are feeling the baby move.  If you have had any abnormal   symptoms, such as leaking fluid, bleeding, severe headaches, or abdominal cramping.  If you are using any tobacco products, including cigarettes, chewing tobacco, and electronic cigarettes.  If you have any questions. Other tests or screenings that may be performed during your third trimester include:  Blood tests that check for low iron levels (anemia).  Fetal testing to check the health, activity level, and growth of the fetus. Testing is done if you have certain medical conditions or if there are problems during the pregnancy.  Nonstress test  (NST). This test checks the health of your baby to make sure there are no signs of problems, such as the baby not getting enough oxygen. During this test, a belt is placed around your belly. The baby is made to move, and its heart rate is monitored during movement. What is false labor? False labor is a condition in which you feel small, irregular tightenings of the muscles in the womb (contractions) that usually go away with rest, changing position, or drinking water. These are called Braxton Hicks contractions. Contractions may last for hours, days, or even weeks before true labor sets in. If contractions come at regular intervals, become more frequent, increase in intensity, or become painful, you should see your health care provider. What are the signs of labor?  Abdominal cramps.  Regular contractions that start at 10 minutes apart and become stronger and more frequent with time.  Contractions that start on the top of the uterus and spread down to the lower abdomen and back.  Increased pelvic pressure and dull back pain.  A watery or bloody mucus discharge that comes from the vagina.  Leaking of amniotic fluid. This is also known as your "water breaking." It could be a slow trickle or a gush. Let your health care provider know if it has a color or strange odor. If you have any of these signs, call your health care provider right away, even if it is before your due date. Follow these instructions at home: Medicines  Follow your health care provider's instructions regarding medicine use. Specific medicines may be either safe or unsafe to take during pregnancy.  Take a prenatal vitamin that contains at least 600 micrograms (mcg) of folic acid.  If you develop constipation, try taking a stool softener if your health care provider approves. Eating and drinking   Eat a balanced diet that includes fresh fruits and vegetables, whole grains, good sources of protein such as meat, eggs, or tofu,  and low-fat dairy. Your health care provider will help you determine the amount of weight gain that is right for you.  Avoid raw meat and uncooked cheese. These carry germs that can cause birth defects in the baby.  If you have low calcium intake from food, talk to your health care provider about whether you should take a daily calcium supplement.  Eat four or five small meals rather than three large meals a day.  Limit foods that are high in fat and processed sugars, such as fried and sweet foods.  To prevent constipation: ? Drink enough fluid to keep your urine clear or pale yellow. ? Eat foods that are high in fiber, such as fresh fruits and vegetables, whole grains, and beans. Activity  Exercise only as directed by your health care provider. Most women can continue their usual exercise routine during pregnancy. Try to exercise for 30 minutes at least 5 days a week. Stop exercising if you experience uterine contractions.  Avoid heavy lifting.  Do   not exercise in extreme heat or humidity, or at high altitudes.  Wear low-heel, comfortable shoes.  Practice good posture.  You may continue to have sex unless your health care provider tells you otherwise. Relieving pain and discomfort  Take frequent breaks and rest with your legs elevated if you have leg cramps or low back pain.  Take warm sitz baths to soothe any pain or discomfort caused by hemorrhoids. Use hemorrhoid cream if your health care provider approves.  Wear a good support bra to prevent discomfort from breast tenderness.  If you develop varicose veins: ? Wear support pantyhose or compression stockings as told by your healthcare provider. ? Elevate your feet for 15 minutes, 3-4 times a day. Prenatal care  Write down your questions. Take them to your prenatal visits.  Keep all your prenatal visits as told by your health care provider. This is important. Safety  Wear your seat belt at all times when driving.  Make  a list of emergency phone numbers, including numbers for family, friends, the hospital, and police and fire departments. General instructions  Avoid cat litter boxes and soil used by cats. These carry germs that can cause birth defects in the baby. If you have a cat, ask someone to clean the litter box for you.  Do not travel far distances unless it is absolutely necessary and only with the approval of your health care provider.  Do not use hot tubs, steam rooms, or saunas.  Do not drink alcohol.  Do not use any products that contain nicotine or tobacco, such as cigarettes and e-cigarettes. If you need help quitting, ask your health care provider.  Do not use any medicinal herbs or unprescribed drugs. These chemicals affect the formation and growth of the baby.  Do not douche or use tampons or scented sanitary pads.  Do not cross your legs for long periods of time.  To prepare for the arrival of your baby: ? Take prenatal classes to understand, practice, and ask questions about labor and delivery. ? Make a trial run to the hospital. ? Visit the hospital and tour the maternity area. ? Arrange for maternity or paternity leave through employers. ? Arrange for family and friends to take care of pets while you are in the hospital. ? Purchase a rear-facing car seat and make sure you know how to install it in your car. ? Pack your hospital bag. ? Prepare the baby's nursery. Make sure to remove all pillows and stuffed animals from the baby's crib to prevent suffocation.  Visit your dentist if you have not gone during your pregnancy. Use a soft toothbrush to brush your teeth and be gentle when you floss. Contact a health care provider if:  You are unsure if you are in labor or if your water has broken.  You become dizzy.  You have mild pelvic cramps, pelvic pressure, or nagging pain in your abdominal area.  You have lower back pain.  You have persistent nausea, vomiting, or diarrhea.   You have an unusual or bad smelling vaginal discharge.  You have pain when you urinate. Get help right away if:  Your water breaks before 37 weeks.  You have regular contractions less than 5 minutes apart before 37 weeks.  You have a fever.  You are leaking fluid from your vagina.  You have spotting or bleeding from your vagina.  You have severe abdominal pain or cramping.  You have rapid weight loss or weight gain.  You have   shortness of breath with chest pain.  You notice sudden or extreme swelling of your face, hands, ankles, feet, or legs.  Your baby makes fewer than 10 movements in 2 hours.  You have severe headaches that do not go away when you take medicine.  You have vision changes. Summary  The third trimester is from week 28 through week 40, months 7 through 9. The third trimester is a time when the unborn baby (fetus) is growing rapidly.  During the third trimester, your discomfort may increase as you and your baby continue to gain weight. You may have abdominal, leg, and back pain, sleeping problems, and an increased need to urinate.  During the third trimester your breasts will keep growing and they will continue to become tender. A yellow fluid (colostrum) may leak from your breasts. This is the first milk you are producing for your baby.  False labor is a condition in which you feel small, irregular tightenings of the muscles in the womb (contractions) that eventually go away. These are called Braxton Hicks contractions. Contractions may last for hours, days, or even weeks before true labor sets in.  Signs of labor can include: abdominal cramps; regular contractions that start at 10 minutes apart and become stronger and more frequent with time; watery or bloody mucus discharge that comes from the vagina; increased pelvic pressure and dull back pain; and leaking of amniotic fluid. This information is not intended to replace advice given to you by your health  care provider. Make sure you discuss any questions you have with your health care provider. Document Released: 08/11/2001 Document Revised: 12/08/2018 Document Reviewed: 09/22/2016 Elsevier Patient Education  2020 [employer]  

## 2019-05-03 NOTE — Progress Notes (Signed)
    Routine Prenatal Care Visit  Subjective  Natasha Alvarez is a 28 y.o. G1P0 at [redacted]w[redacted]d being seen today for ongoing prenatal care.  She is currently monitored for the following issues for this low-risk pregnancy and has Supervision of normal pregnancy, antepartum; Tenosynovitis of wrist; Hemoglobin C trait ([place]); and Obesity in pregnancy on their problem list.  ----------------------------------------------------------------------------------- Patient reports no complaints.   Contractions: Not present. Vag. Bleeding: None.  Movement: Present. No leaking of fluid.  ----------------------------------------------------------------------------------- The following portions of the patient's history were reviewed and updated as appropriate: allergies, current medications, past family history, past medical history, past social history, past surgical history and problem list. Problem list updated.   Objective  Blood pressure 120/80, weight 280 lb (127 kg), last menstrual period 08/14/2018. Pregravid weight 215 lb (97.5 kg) Total Weight Gain 65 lb (29.5 kg)  Fetal Status: Fetal Heart Rate (bpm): 145 Fundal Height: 37 cm Movement: Present     General:  Alert, oriented and cooperative. Patient is in no acute distress.  Skin: Skin is warm and dry. No rash noted.   Cardiovascular: Normal heart rate noted  Respiratory: Normal respiratory effort, no problems with respiration noted  Abdomen: Soft, gravid, appropriate for gestational age. Pain/Pressure: Absent     Pelvic:  Cervical exam performed Dilation: Closed Effacement (%): Thick Station: Ballotable  Extremities: Normal range of motion.  Edema: Trace  Mental Status: Normal mood and affect. Normal behavior. Normal judgment and thought content.     Assessment   28 y.o. G1P0 at [redacted]w[redacted]d, EDD 05/31/2019 by Ultrasound presenting for a routine prenatal visit.  Plan   Pregnancy #1 Problems (from 11/09/18 to present)    Problem Noted Resolved   Supervision of normal pregnancy, antepartum 11/15/2018 by Rexene Agent, CNM No   Overview Addendum 02/01/2019 11:04 AM by Rexene Agent, [place] Prenatal Labs  Dating Ultrasound at [redacted]w[redacted]d Blood type: A/Positive/-- (04/01 1210)   Genetic Screen NIPS: Negative XY Antibody:Negative (04/01 1210)  Anatomic [place] Complete 02/01/2019 Rubella: 4.26 (04/01 1210) Varicella: Immune  GTT Early: N/A             Third trimester:  RPR: Non Reactive (04/01 1210)   Rhogam N/A HBsAg: Negative (04/01 1210)   TDaP vaccine                       Flu Shot: HIV: Non Reactive (04/01 1210)   Baby Food                                GBS:   Contraception  Pap: 11/09/2018, NILM  CBB     CS/VBAC    Support Person               GBS with sensitivities and Aptima today.  Preterm labor symptoms and general obstetric precautions including but not limited to vaginal bleeding, contractions, leaking of fluid and fetal movement were reviewed.  Please refer to After Visit Summary for other counseling recommendations.   Return in about 1 week (around 05/10/2019) for ROB.  Avel Sensor, CNM 05/03/2019  12:11 PM

## 2019-05-05 LAB — CERVICOVAGINAL ANCILLARY ONLY
Chlamydia: NEGATIVE
Neisseria Gonorrhea: NEGATIVE

## 2019-05-07 LAB — STREP GP B CULTURE+RFLX: Strep Gp B Culture+Rflx: NEGATIVE

## 2019-05-12 ENCOUNTER — Other Ambulatory Visit: Payer: Self-pay

## 2019-05-12 ENCOUNTER — Ambulatory Visit (INDEPENDENT_AMBULATORY_CARE_PROVIDER_SITE_OTHER): Payer: BC Managed Care – PPO | Admitting: Certified Nurse Midwife

## 2019-05-12 VITALS — BP 110/70 | Wt 280.0 lb

## 2019-05-12 DIAGNOSIS — O99210 Obesity complicating pregnancy, unspecified trimester: Secondary | ICD-10-CM

## 2019-05-12 DIAGNOSIS — O0993 Supervision of high risk pregnancy, unspecified, third trimester: Secondary | ICD-10-CM | POA: Diagnosis not present

## 2019-05-12 DIAGNOSIS — Z3A37 37 weeks gestation of pregnancy: Secondary | ICD-10-CM | POA: Diagnosis not present

## 2019-05-12 DIAGNOSIS — Z6841 Body Mass Index (BMI) 40.0 and over, adult: Secondary | ICD-10-CM

## 2019-05-12 DIAGNOSIS — O0990 Supervision of high risk pregnancy, unspecified, unspecified trimester: Secondary | ICD-10-CM

## 2019-05-12 DIAGNOSIS — O99213 Obesity complicating pregnancy, third trimester: Secondary | ICD-10-CM | POA: Diagnosis not present

## 2019-05-12 DIAGNOSIS — F1290 Cannabis use, unspecified, uncomplicated: Secondary | ICD-10-CM | POA: Diagnosis not present

## 2019-05-12 LAB — POCT URINALYSIS DIPSTICK OB
Glucose, UA: NEGATIVE
POC,PROTEIN,UA: NEGATIVE

## 2019-05-12 NOTE — Progress Notes (Signed)
C/o lots of B-H ctxs.rj

## 2019-05-13 LAB — URINE DRUG PANEL 7
Amphetamines, Urine: NEGATIVE ng/mL
Barbiturate Quant, Ur: NEGATIVE ng/mL
Benzodiazepine Quant, Ur: NEGATIVE ng/mL
Cannabinoid Quant, Ur: NEGATIVE ng/mL
Cocaine (Metab.): NEGATIVE ng/mL
Opiate Quant, Ur: NEGATIVE ng/mL
PCP Quant, Ur: NEGATIVE ng/mL

## 2019-05-14 NOTE — Progress Notes (Signed)
ROB at 37wk4d: Has gained 65# with pregnancy and BMI now 41.35. Having irregular contractions. No vaginal bleeding or leakage of fluid. Baby active. GBS negative NST reactive with baseline 135 and accelerations to 150, moderate variability Cervix; FT/75%/-1 Labor precautions [facility] instructions  ROB/ NST/ AFI in 1 week  Dalia Heading, CNM

## 2019-05-17 ENCOUNTER — Encounter: Payer: Self-pay | Admitting: Advanced Practice Midwife

## 2019-05-17 ENCOUNTER — Ambulatory Visit (INDEPENDENT_AMBULATORY_CARE_PROVIDER_SITE_OTHER): Payer: BC Managed Care – PPO

## 2019-05-17 ENCOUNTER — Other Ambulatory Visit: Payer: Self-pay

## 2019-05-17 ENCOUNTER — Ambulatory Visit (INDEPENDENT_AMBULATORY_CARE_PROVIDER_SITE_OTHER): Payer: BC Managed Care – PPO | Admitting: Advanced Practice Midwife

## 2019-05-17 VITALS — BP 112/60 | Wt 287.0 lb

## 2019-05-17 DIAGNOSIS — O99210 Obesity complicating pregnancy, unspecified trimester: Secondary | ICD-10-CM

## 2019-05-17 DIAGNOSIS — Z3689 Encounter for other specified antenatal screening: Secondary | ICD-10-CM | POA: Diagnosis not present

## 2019-05-17 DIAGNOSIS — Z3403 Encounter for supervision of normal first pregnancy, third trimester: Secondary | ICD-10-CM

## 2019-05-17 DIAGNOSIS — Z6841 Body Mass Index (BMI) 40.0 and over, adult: Secondary | ICD-10-CM

## 2019-05-17 DIAGNOSIS — Z3A38 38 weeks gestation of pregnancy: Secondary | ICD-10-CM

## 2019-05-17 NOTE — Patient Instructions (Signed)
Braxton Hicks Contractions Contractions of the uterus can occur throughout pregnancy, but they are not always a sign that you are in labor. You may have practice contractions called Braxton Hicks contractions. These false labor contractions are sometimes confused with true labor. What are Braxton Hicks contractions? Braxton Hicks contractions are tightening movements that occur in the muscles of the uterus before labor. Unlike true labor contractions, these contractions do not result in opening (dilation) and thinning of the cervix. Toward the end of pregnancy (32-34 weeks), Braxton Hicks contractions can happen more often and may become stronger. These contractions are sometimes difficult to tell apart from true labor because they can be very uncomfortable. You should not feel embarrassed if you go to the hospital with false labor. Sometimes, the only way to tell if you are in true labor is for your health care provider to look for changes in the cervix. The health care provider will do a physical exam and may monitor your contractions. If you are not in true labor, the exam should show that your cervix is not dilating and your water has not broken. If there are no other health problems associated with your pregnancy, it is completely safe for you to be sent home with false labor. You may continue to have Braxton Hicks contractions until you go into true labor. How to tell the difference between true labor and false labor True labor  Contractions last 30-70 seconds.  Contractions become very regular.  Discomfort is usually felt in the top of the uterus, and it spreads to the lower abdomen and low back.  Contractions do not go away with walking.  Contractions usually become more intense and increase in frequency.  The cervix dilates and gets thinner. False labor  Contractions are usually shorter and not as strong as true labor contractions.  Contractions are usually irregular.  Contractions  are often felt in the front of the lower abdomen and in the groin.  Contractions may go away when you walk around or change positions while lying down.  Contractions get weaker and are shorter-lasting as time goes on.  The cervix usually does not dilate or become thin. Follow these instructions at home:   Take over-the-counter and prescription medicines only as told by your health care provider.  Keep up with your usual exercises and follow other instructions from your health care provider.  Eat and drink lightly if you think you are going into labor.  If Braxton Hicks contractions are making you uncomfortable: ? Change your position from lying down or resting to walking, or change from walking to resting. ? Sit and rest in a tub of warm water. ? Drink enough fluid to keep your urine pale yellow. Dehydration may cause these contractions. ? Do slow and deep breathing several times an hour.  Keep all follow-up prenatal visits as told by your health care provider. This is important. Contact a health care provider if:  You have a fever.  You have continuous pain in your abdomen. Get help right away if:  Your contractions become stronger, more regular, and closer together.  You have fluid leaking or gushing from your vagina.  You pass blood-tinged mucus (bloody show).  You have bleeding from your vagina.  You have low back pain that you never had before.  You feel your baby's head pushing down and causing pelvic pressure.  Your baby is not moving inside you as much as it used to. Summary  Contractions that occur before labor are   called Braxton Hicks contractions, false labor, or practice contractions.  Braxton Hicks contractions are usually shorter, weaker, farther apart, and less regular than true labor contractions. True labor contractions usually become progressively stronger and regular, and they become more frequent.  Manage discomfort from Braxton Hicks contractions  by changing position, resting in a warm bath, drinking plenty of water, or practicing deep breathing. This information is not intended to replace advice given to you by your health care provider. Make sure you discuss any questions you have with your health care provider. Document Released: 12/31/2016 Document Revised: 07/30/2017 Document Reviewed: 12/31/2016 Elsevier Patient Education  2020 [employer]  

## 2019-05-17 NOTE — Progress Notes (Signed)
  Routine Prenatal Care Visit  Subjective  Natasha Alvarez is a 28 y.o. G1P0 at [redacted]w[redacted]d being seen today for ongoing prenatal care.  She is currently monitored for the following issues for this low-risk pregnancy and has Supervision of normal pregnancy, antepartum; Tenosynovitis of wrist; Hemoglobin C trait ([place]); and Obesity in pregnancy on their problem list.  ----------------------------------------------------------------------------------- Patient reports fatigue, braxton hicks ctxs. We discussed her 7 pound weight gain in 1 week. She denies fluid weight gain. She denies headaches, visual changes or epigastric pain. BP is normal.  Contractions: Not present. Vag. Bleeding: None.  Movement: Present. Leaking Fluid denies.  ----------------------------------------------------------------------------------- The following portions of the patient's history were reviewed and updated as appropriate: allergies, current medications, past family history, past medical history, past social history, past surgical history and problem list. Problem list updated.  Objective  Blood pressure 112/60, weight 287 lb (130.2 kg), last menstrual period 08/14/2018. Pregravid weight 215 lb (97.5 kg) Total Weight Gain 72 lb (32.7 kg) Urinalysis: Urine Protein    Urine Glucose    Fetal Status: Fetal Heart Rate (bpm): 130   Movement: Present     General:  Alert, oriented and cooperative. Patient is in no acute distress.  Skin: Skin is warm and dry. No rash noted.   Cardiovascular: Normal heart rate noted  Respiratory: Normal respiratory effort, no problems with respiration noted  Abdomen: Soft, gravid, appropriate for gestational age. Pain/Pressure: Present     Pelvic:  Cervical exam deferred        Extremities: Normal range of motion.     Mental Status: Normal mood and affect. Normal behavior. Normal judgment and thought content.   Assessment   28 y.o. G1P0 at [redacted]w[redacted]d by  05/31/2019, by Ultrasound presenting for  routine prenatal visit  Plan   Pregnancy #1 Problems (from 11/09/18 to present)    Problem Noted Resolved   Supervision of normal pregnancy, antepartum 11/15/2018 by Rexene Agent, CNM No   Overview Addendum 02/01/2019 11:04 AM by Rexene Agent, [place] Prenatal Labs  Dating Ultrasound at [redacted]w[redacted]d Blood type: A/Positive/-- (04/01 1210)   Genetic Screen NIPS: Negative XY Antibody:Negative (04/01 1210)  Anatomic [place] Complete 02/01/2019 Rubella: 4.26 (04/01 1210) Varicella: Immune  GTT Early: N/A             Third trimester:  RPR: Non Reactive (04/01 1210)   Rhogam N/A HBsAg: Negative (04/01 1210)   TDaP vaccine                       Flu Shot: HIV: Non Reactive (04/01 1210)   Baby Food                                GBS:   Contraception  Pap: 11/09/2018, NILM  CBB     CS/VBAC    Support Person                  Term labor symptoms and general obstetric precautions including but not limited to vaginal bleeding, contractions, leaking of fluid and fetal movement were reviewed in detail with the patient. Please refer to After Visit Summary for other counseling recommendations.   Return in about 1 week (around 05/24/2019) for rob.  Rod Can, CNM 05/17/2019 2:25 PM

## 2019-05-17 NOTE — Progress Notes (Signed)
ROB C/o fatigue, braxton hicks, pelvic pressure Denies lof, no vb Good FM Declines flu shot

## 2019-05-24 ENCOUNTER — Encounter: Payer: Self-pay | Admitting: Obstetrics and Gynecology

## 2019-05-24 ENCOUNTER — Other Ambulatory Visit: Payer: Self-pay

## 2019-05-24 ENCOUNTER — Ambulatory Visit (INDEPENDENT_AMBULATORY_CARE_PROVIDER_SITE_OTHER): Payer: BC Managed Care – PPO | Admitting: Obstetrics and Gynecology

## 2019-05-24 VITALS — BP 134/72 | Wt 293.0 lb

## 2019-05-24 DIAGNOSIS — Z3483 Encounter for supervision of other normal pregnancy, third trimester: Secondary | ICD-10-CM

## 2019-05-24 DIAGNOSIS — Z3480 Encounter for supervision of other normal pregnancy, unspecified trimester: Secondary | ICD-10-CM

## 2019-05-24 DIAGNOSIS — Z3A39 39 weeks gestation of pregnancy: Secondary | ICD-10-CM

## 2019-05-24 LAB — POCT URINALYSIS DIPSTICK OB: Glucose, UA: NEGATIVE

## 2019-05-24 NOTE — Progress Notes (Signed)
ROB C/o contractions/ pelvic pressure  Denies lof, no vb, Good FM

## 2019-05-24 NOTE — Progress Notes (Signed)
    Routine Prenatal Care Visit  Subjective  Natasha Alvarez is a 28 y.o. G1P0 at [redacted]w[redacted]d being seen today for ongoing prenatal care.  She is currently monitored for the following issues for this high-risk pregnancy and has Supervision of normal pregnancy, antepartum; Tenosynovitis of wrist; Hemoglobin C trait ([place]); and Obesity in pregnancy on their problem list.  ----------------------------------------------------------------------------------- Patient reports no complaints.   Contractions: Irregular. Vag. Bleeding: None.  Movement: Present. Denies leaking of fluid.  ----------------------------------------------------------------------------------- The following portions of the patient's history were reviewed and updated as appropriate: allergies, current medications, past family history, past medical history, past social history, past surgical history and problem list. Problem list updated.   Objective  Blood pressure 134/72, weight 293 lb (132.9 kg), last menstrual period 08/14/2018. Pregravid weight 215 lb (97.5 kg) Total Weight Gain 78 lb (35.4 kg) Urinalysis:      Fetal Status: Fetal Heart Rate (bpm): 125 Fundal Height: 41 cm Movement: Present     General:  Alert, oriented and cooperative. Patient is in no acute distress.  Skin: Skin is warm and dry. No rash noted.   Cardiovascular: Normal heart rate noted  Respiratory: Normal respiratory effort, no problems with respiration noted  Abdomen: Soft, gravid, appropriate for gestational age. Pain/Pressure: Present     Pelvic:  Cervical exam performed Dilation: 3 Effacement (%): 70 Station: -3  Extremities: Normal range of motion.  Edema: Mild pitting, slight indentation  Mental Status: Normal mood and affect. Normal behavior. Normal judgment and thought content.     Assessment   28 y.o. G1P0 at [redacted]w[redacted]d by  05/31/2019, by Ultrasound presenting for routine prenatal visit  Plan   Pregnancy #1 Problems (from 11/09/18 to present)    Problem Noted Resolved   Supervision of normal pregnancy, antepartum 11/15/2018 by Rexene Agent, CNM No   Overview Addendum 05/24/2019  4:26 PM by Homero Fellers, MD    [facility] Prenatal Labs  Dating Ultrasound at [redacted]w[redacted]d Blood type: A/Positive/-- (04/01 1210)   Genetic Screen NIPS: Negative XY Antibody:Negative (04/01 1210)  Anatomic [place] Complete 02/01/2019 Rubella: 4.26 (04/01 1210) Varicella: Immune  GTT Early: N/A             Third trimester: 91 RPR: Non Reactive (04/01 1210)   Rhogam N/A HBsAg: Negative (04/01 1210)   TDaP vaccine   03/29/2019                     Flu Shot:Declines HIV: Non Reactive (04/01 1210)   Baby Food     Breast/Bottle                           GBS:  Negative  Contraception  POP/OCP Pap: 11/09/2018, NILM  CBB     CS/VBAC    Support Person  mom- Dellis Filbert                 Gestational age appropriate obstetric precautions including but not limited to vaginal bleeding, contractions, leaking of fluid and fetal movement were reviewed in detail with the patient.    Denies HA or vision changes, discussed Pre-E warning signs Declines IOL scheduling.  Declines membrane sweep.   Return in about 1 week (around 05/31/2019) for ROB in person.  Homero Fellers MD [facility], [facility] 05/25/2019, 11:19 PM

## 2019-05-31 ENCOUNTER — Ambulatory Visit (INDEPENDENT_AMBULATORY_CARE_PROVIDER_SITE_OTHER): Payer: BC Managed Care – PPO | Admitting: Advanced Practice Midwife

## 2019-05-31 ENCOUNTER — Other Ambulatory Visit: Payer: Self-pay

## 2019-05-31 ENCOUNTER — Encounter: Payer: Self-pay | Admitting: Advanced Practice Midwife

## 2019-05-31 ENCOUNTER — Encounter: Payer: BC Managed Care – PPO | Admitting: Advanced Practice Midwife

## 2019-05-31 VITALS — BP 136/88 | Wt 294.0 lb

## 2019-05-31 DIAGNOSIS — Z3A40 40 weeks gestation of pregnancy: Secondary | ICD-10-CM

## 2019-05-31 DIAGNOSIS — O480 Post-term pregnancy: Secondary | ICD-10-CM

## 2019-05-31 NOTE — Patient Instructions (Signed)

## 2019-05-31 NOTE — Progress Notes (Signed)
  Routine Prenatal Care Visit  Subjective  Natasha Alvarez is a 28 y.o. G1P0 at [redacted]w[redacted]d being seen today for ongoing prenatal care.  She is currently monitored for the following issues for this low-risk pregnancy and has Supervision of normal pregnancy, antepartum; Tenosynovitis of wrist; Hemoglobin C trait ([place]); and Obesity in pregnancy on their problem list.  ----------------------------------------------------------------------------------- Patient reports braxton hicks contractions.   Contractions: Irregular. Vag. Bleeding: None.  Movement: Present. Leaking Fluid denies.  ----------------------------------------------------------------------------------- The following portions of the patient's history were reviewed and updated as appropriate: allergies, current medications, past family history, past medical history, past social history, past surgical history and problem list. Problem list updated.  Objective  Blood pressure 136/88, weight 294 lb (133.4 kg), last menstrual period 08/14/2018. Pregravid weight 215 lb (97.5 kg) Total Weight Gain 79 lb (35.8 kg) Urinalysis: Urine Protein    Urine Glucose    Fetal Status: Fetal Heart Rate (bpm): 133   Movement: Present  Presentation: Vertex  General:  Alert, oriented and cooperative. Patient is in no acute distress.  Skin: Skin is warm and dry. No rash noted.   Cardiovascular: Normal heart rate noted  Respiratory: Normal respiratory effort, no problems with respiration noted  Abdomen: Soft, gravid, appropriate for gestational age. Pain/Pressure: Present     Pelvic:  Cervical exam performed Dilation: 3 Effacement (%): 80 Station: -2  Extremities: Normal range of motion.     Mental Status: Normal mood and affect. Normal behavior. Normal judgment and thought content.   Assessment   28 y.o. G1P0 at [redacted]w[redacted]d by  05/31/2019, by Ultrasound presenting for routine prenatal visit  Plan   Pregnancy #1 Problems (from 11/09/18 to present)    Problem  Noted Resolved   Supervision of normal pregnancy, antepartum 11/15/2018 by Rexene Agent, CNM No   Overview Addendum 05/24/2019  4:26 PM by Homero Fellers, [place]  Dating Ultrasound at [redacted]w[redacted]d Blood type: A/Positive/-- (04/01 1210)   Genetic Screen NIPS: Negative XY Antibody:Negative (04/01 1210)  Anatomic [place] Complete 02/01/2019 Rubella: 4.26 (04/01 1210) Varicella: Immune  GTT Early: N/A             Third trimester: 91 RPR: Non Reactive (04/01 1210)   Rhogam N/A HBsAg: Negative (04/01 1210)   TDaP vaccine   03/29/2019                     Flu Shot:Declines HIV: Non Reactive (04/01 1210)   Baby Food     Breast/Bottle                           GBS:  Negative  Contraception  POP/OCP Pap: 11/09/2018, NILM  CBB     CS/VBAC    Support Person  mom- Dellis Filbert                 Term labor symptoms and general obstetric precautions including but not limited to vaginal bleeding, contractions, leaking of fluid and fetal movement were reviewed in detail with the patient. Please refer to After Visit Summary for other counseling recommendations.  Will call patient later today with induction date/time  Return for IOL in 1 week.  Rod Can, CNM 05/31/2019 12:14 PM

## 2019-05-31 NOTE — Progress Notes (Signed)
No vb. No lof. Some contractions. Cervical check today.

## 2019-06-04 ENCOUNTER — Observation Stay
Admission: EM | Admit: 2019-06-04 | Discharge: 2019-06-04 | Disposition: A | Discharge status: Home / Self Care | Payer: BC Managed Care – PPO | Attending: Obstetrics and Gynecology | Admitting: Obstetrics and Gynecology

## 2019-06-04 DIAGNOSIS — Z3A40 40 weeks gestation of pregnancy: Secondary | ICD-10-CM | POA: Diagnosis not present

## 2019-06-04 DIAGNOSIS — Z880 Allergy status to penicillin: Secondary | ICD-10-CM | POA: Diagnosis not present

## 2019-06-04 DIAGNOSIS — Z87891 Personal history of nicotine dependence: Secondary | ICD-10-CM | POA: Diagnosis not present

## 2019-06-04 DIAGNOSIS — O480 Post-term pregnancy: Secondary | ICD-10-CM | POA: Diagnosis not present

## 2019-06-04 DIAGNOSIS — O471 False labor at or after 37 completed weeks of gestation: Secondary | ICD-10-CM | POA: Diagnosis not present

## 2019-06-04 NOTE — Discharge Summary (Addendum)
Physician Final Progress Note  Patient ID: Natasha Alvarez MRN: [id number] DOB/AGE: [birth year] 28 y.o.  Admit date: 06/04/2019 Admitting provider: Adrian Prows, MD Discharge date: 06/04/2019   Admission Diagnoses: contractions  Discharge Diagnoses:  Active Problems:   Indication for care in labor and delivery, antepartum IUP at 40 weeks Reactive NST No cervical change  History of Present Illness: The patient is a 28 y.o. female G1P0 at [redacted]w[redacted]d who presents for contractions that she has felt regularly every 10 minutes since earlier this afternoon. She denies leakage of fluid or vaginal bleeding. She reports good fetal movement.    No past medical history on file.  Past Surgical History:  Procedure Laterality Date  . NO PAST SURGERIES    . NO PAST SURGERIES      No current facility-administered medications on file prior to encounter.    Current Outpatient Medications on File Prior to Encounter  Medication Sig Dispense Refill  . Prenatal Vit-Fe Fumarate-FA (MULTIVITAMIN-PRENATAL) 27-0.8 MG TABS tablet Take 1 tablet by mouth daily at 12 noon.      Allergies  Allergen Reactions  . Amoxicillin Anaphylaxis    Social History   Socioeconomic History  . Marital status: Single    Spouse name: Not on file  . Number of children: Not on file  . Years of education: Not on file  . Highest education level: Not on file  Occupational History  . Not on file  Social Needs  . Financial resource strain: Not on file  . Food insecurity    Worry: Not on file    Inability: Not on file  . Transportation needs    Medical: Not on file    Non-medical: Not on file  Tobacco Use  . Smoking status: Former Smoker    Quit date: 07/01/2018    Years since quitting: 0.9  . Smokeless tobacco: Never Used  . Tobacco comment: 1-2 cigs/wk x 2 months  Substance and Sexual Activity  . Alcohol use: No  . Drug use: No  . Sexual activity: Yes    Partners: Male    Birth control/protection: None   Lifestyle  . Physical activity    Days per week: Not on file    Minutes per session: Not on file  . Stress: Not on file  Relationships  . Social Herbalist on phone: Not on file    Gets together: Not on file    Attends religious service: Not on file    Active member of club or organization: Not on file    Attends meetings of clubs or organizations: Not on file    Relationship status: Not on file  . Intimate partner violence    Fear of current or ex partner: Not on file    Emotionally abused: Not on file    Physically abused: Not on file    Forced sexual activity: Not on file  Other Topics Concern  . Not on file  Social History Narrative  . Not on file    Family History  Problem Relation Age of Onset  . Hypertension Mother   . Breast cancer Neg Hx   . Colon cancer Neg Hx   . Ovarian cancer Neg Hx   . Diabetes Neg Hx      Review of Systems  Constitutional: Negative.   HENT: Negative.   Eyes: Negative.   Respiratory: Negative.   Cardiovascular: Negative.   Gastrointestinal: Negative.   Genitourinary: Negative.   Musculoskeletal: Negative.  Skin: Negative.   Neurological: Negative.   Endo/Heme/Allergies: Negative.   Psychiatric/Behavioral: Negative.      Physical Exam: Temp 98.2 F (36.8 C) (Oral)   Resp 18   Ht 5\' 9"  (1.753 m)   Wt 133 kg   LMP 08/14/2018 (Exact Date)   BMI 43.30 kg/m   Constitutional: Well nourished, well developed female in no acute distress.  HEENT: normal Skin: Warm and dry.  Cardiovascular: Regular rate and rhythm.   Extremity: no edema  Respiratory: Clear to auscultation bilateral. Normal respiratory effort Abdomen: FHT present Back: no CVAT Neuro: DTRs 2+, Cranial nerves grossly intact Psych: Alert and Oriented x3. No memory deficits. Normal mood and affect.  MS: normal gait, normal bilateral lower extremity ROM/strength/stability.  Pelvic exam: (female chaperone present) is not limited by body habitus EGBUS:  within normal limits Vagina: within normal limits and with normal mucosa, streak of blood on glove after exam Cervix: 3 cm/80-90/-1 on arrival and without change after 2 hours walking and in triage.  Toco: q 8-10 minutes, lasting 30 to 60 seconds, mild to palpation Fetal well being: 135 bpm baseline, moderate variability, +accelerations, -decelerations  Consults: None  Significant Findings/ Diagnostic Studies: none  Procedures: NST  Hospital Course: The patient was admitted to Labor and Delivery Triage for observation.   Discharge Condition: good  Disposition: Discharge disposition: 01-Home or Self Care       Diet: Regular diet  Discharge Activity: Activity as tolerated  Discharge Instructions    Discharge activity:  No Restrictions   Complete by: As directed    Discharge diet:  No restrictions   Complete by: As directed    Fetal Kick Count:  Lie on our left side for one hour after a meal, and count the number of times your baby kicks.  If it is less than 5 times, get up, move around and drink some juice.  Repeat the test 30 minutes later.  If it is still less than 5 kicks in an hour, notify your doctor.   Complete by: As directed    LABOR:  When conractions begin, you should start to time them from the beginning of one contraction to the beginning  of the next.  When contractions are 5 - 10 minutes apart or less and have been regular for at least an hour, you should call your health care provider.   Complete by: As directed    No sexual activity restrictions   Complete by: As directed    Notify physician for bleeding from the vagina   Complete by: As directed    Notify physician for blurring of vision or spots before the eyes   Complete by: As directed    Notify physician for chills or fever   Complete by: As directed    Notify physician for fainting spells, "black outs" or loss of consciousness   Complete by: As directed    Notify physician for increase in vaginal  discharge   Complete by: As directed    Notify physician for leaking of fluid   Complete by: As directed    Notify physician for pain or burning when urinating   Complete by: As directed    Notify physician for pelvic pressure (sudden increase)   Complete by: As directed    Notify physician for severe or continued nausea or vomiting   Complete by: As directed    Notify physician for sudden gushing of fluid from the vagina (with or without continued leaking)  Complete by: As directed    Notify physician for sudden, constant, or occasional abdominal pain   Complete by: As directed    Notify physician if baby moving less than usual   Complete by: As directed      Allergies as of 06/04/2019      Reactions   Amoxicillin Anaphylaxis      Medication List    TAKE these medications   multivitamin-prenatal 27-0.8 MG Tabs tablet Take 1 tablet by mouth daily at 12 noon.        Total time spent taking care of this patient: 15 minutes  Signed: Tresea MallJane Lannie Heaps, CNM  06/04/2019, 5:58 PM

## 2019-06-05 ENCOUNTER — Other Ambulatory Visit
Admission: RE | Admit: 2019-06-05 | Discharge: 2019-06-05 | Disposition: A | Discharge status: Home / Self Care | Payer: BC Managed Care – PPO | Source: Ambulatory Visit | Attending: Obstetrics and Gynecology | Admitting: Obstetrics and Gynecology

## 2019-06-05 DIAGNOSIS — Z20828 Contact with and (suspected) exposure to other viral communicable diseases: Secondary | ICD-10-CM | POA: Diagnosis not present

## 2019-06-05 DIAGNOSIS — Z01812 Encounter for preprocedural laboratory examination: Secondary | ICD-10-CM | POA: Insufficient documentation

## 2019-06-05 LAB — SARS CORONAVIRUS 2 (TAT 6-24 HRS): SARS Coronavirus 2: NEGATIVE

## 2019-06-07 ENCOUNTER — Encounter: Payer: BC Managed Care – PPO | Admitting: Obstetrics and Gynecology

## 2019-06-07 ENCOUNTER — Encounter: Payer: Self-pay | Admitting: *Deleted

## 2019-06-07 ENCOUNTER — Inpatient Hospital Stay: Payer: BC Managed Care – PPO | Admitting: Anesthesiology

## 2019-06-07 ENCOUNTER — Inpatient Hospital Stay
Admission: EM | Admit: 2019-06-07 | Discharge: 2019-06-10 | DRG: 787 | Disposition: A | Discharge status: Home / Self Care | Payer: BC Managed Care – PPO | Attending: Maternal Newborn | Admitting: Maternal Newborn

## 2019-06-07 ENCOUNTER — Other Ambulatory Visit: Payer: Self-pay | Admitting: Obstetrics and Gynecology

## 2019-06-07 ENCOUNTER — Other Ambulatory Visit: Payer: Self-pay

## 2019-06-07 DIAGNOSIS — Z880 Allergy status to penicillin: Secondary | ICD-10-CM | POA: Diagnosis not present

## 2019-06-07 DIAGNOSIS — Z3490 Encounter for supervision of normal pregnancy, unspecified, unspecified trimester: Secondary | ICD-10-CM | POA: Diagnosis present

## 2019-06-07 DIAGNOSIS — O99214 Obesity complicating childbirth: Secondary | ICD-10-CM | POA: Diagnosis not present

## 2019-06-07 DIAGNOSIS — O480 Post-term pregnancy: Secondary | ICD-10-CM | POA: Diagnosis not present

## 2019-06-07 DIAGNOSIS — O9081 Anemia of the puerperium: Secondary | ICD-10-CM | POA: Diagnosis not present

## 2019-06-07 DIAGNOSIS — Z87891 Personal history of nicotine dependence: Secondary | ICD-10-CM | POA: Diagnosis not present

## 2019-06-07 DIAGNOSIS — D62 Acute posthemorrhagic anemia: Secondary | ICD-10-CM | POA: Diagnosis not present

## 2019-06-07 DIAGNOSIS — O99210 Obesity complicating pregnancy, unspecified trimester: Secondary | ICD-10-CM | POA: Diagnosis present

## 2019-06-07 DIAGNOSIS — Z3A41 41 weeks gestation of pregnancy: Secondary | ICD-10-CM

## 2019-06-07 DIAGNOSIS — Z98891 History of uterine scar from previous surgery: Secondary | ICD-10-CM

## 2019-06-07 LAB — CBC
HCT: 31.1 % — ABNORMAL LOW (ref 36.0–46.0)
Hemoglobin: 10.5 g/dL — ABNORMAL LOW (ref 12.0–15.0)
MCH: 25.2 pg — ABNORMAL LOW (ref 26.0–34.0)
MCHC: 33.8 g/dL (ref 30.0–36.0)
MCV: 74.6 fL — ABNORMAL LOW (ref 80.0–100.0)
Platelets: 202 10*3/uL (ref 150–400)
RBC: 4.17 MIL/uL (ref 3.87–5.11)
RDW: 15.2 % (ref 11.5–15.5)
WBC: 8.3 10*3/uL (ref 4.0–10.5)
nRBC: 0 % (ref 0.0–0.2)

## 2019-06-07 LAB — TYPE AND SCREEN
ABO/RH(D): A POS
Antibody Screen: NEGATIVE

## 2019-06-07 LAB — RPR: RPR Ser Ql: NONREACTIVE

## 2019-06-07 MED ORDER — EPHEDRINE 5 MG/ML INJ: 10.0000 mg | INTRAVENOUS | Status: DC | PRN

## 2019-06-07 MED ORDER — LIDOCAINE HCL (PF) 1 % IJ SOLN
30.0000 mL | INTRAMUSCULAR | Status: DC | PRN
  Filled 2019-06-07: qty 30

## 2019-06-07 MED ORDER — LACTATED RINGERS IV SOLN
INTRAVENOUS | Status: DC
  Administered 2019-06-07: 1000 mL via INTRAVENOUS
  Administered 2019-06-07: 15:00:00 via INTRAVENOUS

## 2019-06-07 MED ORDER — LACTATED RINGERS IV SOLN: 500.0000 mL | Freq: Once | INTRAVENOUS | Status: DC

## 2019-06-07 MED ORDER — LACTATED RINGERS IV SOLN: 500.0000 mL | INTRAVENOUS | Status: DC | PRN

## 2019-06-07 MED ORDER — TERBUTALINE SULFATE 1 MG/ML IJ SOLN
0.2500 mg | Freq: Once | INTRAMUSCULAR | Status: AC | PRN
  Administered 2019-06-07: 0.25 mg via SUBCUTANEOUS
  Filled 2019-06-07: qty 1

## 2019-06-07 MED ORDER — ACETAMINOPHEN 325 MG PO TABS: 650.0000 mg | ORAL_TABLET | ORAL | Status: DC | PRN

## 2019-06-07 MED ORDER — OXYTOCIN BOLUS FROM INFUSION: 500.0000 mL | Freq: Once | INTRAVENOUS | Status: DC

## 2019-06-07 MED ORDER — FENTANYL 2.5 MCG/ML W/ROPIVACAINE 0.15% IN NS 100 ML EPIDURAL (ARMC)
12.0000 mL/h | EPIDURAL | Status: DC
  Administered 2019-06-07 – 2019-06-08 (×2): 12 mL/h via EPIDURAL
  Filled 2019-06-07: qty 100

## 2019-06-07 MED ORDER — LIDOCAINE-EPINEPHRINE (PF) 1.5 %-1:200000 IJ SOLN
INTRAMUSCULAR | Status: DC | PRN
  Administered 2019-06-07: 3 mL via EPIDURAL

## 2019-06-07 MED ORDER — PHENYLEPHRINE 40 MCG/ML (10ML) SYRINGE FOR IV PUSH (FOR BLOOD PRESSURE SUPPORT): 80.0000 ug | PREFILLED_SYRINGE | INTRAVENOUS | Status: DC | PRN

## 2019-06-07 MED ORDER — SODIUM CHLORIDE 0.9 % IV SOLN
INTRAVENOUS | Status: DC | PRN
  Administered 2019-06-07 (×2): 5 mL via EPIDURAL

## 2019-06-07 MED ORDER — ONDANSETRON HCL 4 MG/2ML IJ SOLN: 4.0000 mg | Freq: Four times a day (QID) | INTRAMUSCULAR | Status: DC | PRN

## 2019-06-07 MED ORDER — OXYTOCIN 40 UNITS IN NORMAL SALINE INFUSION - SIMPLE MED
2.5000 [IU]/h | INTRAVENOUS | Status: DC
  Administered 2019-06-08 (×2): 1000 mL via INTRAVENOUS
  Filled 2019-06-07: qty 1000

## 2019-06-07 MED ORDER — OXYTOCIN 40 UNITS IN NORMAL SALINE INFUSION - SIMPLE MED
1.0000 m[IU]/min | INTRAVENOUS | Status: DC
  Administered 2019-06-07: 2 m[IU]/min via INTRAVENOUS
  Filled 2019-06-07: qty 1000

## 2019-06-07 MED ORDER — LIDOCAINE HCL (PF) 1 % IJ SOLN
INTRAMUSCULAR | Status: DC | PRN
  Administered 2019-06-07 (×2): 1 mL via INTRADERMAL

## 2019-06-07 MED ORDER — DIPHENHYDRAMINE HCL 50 MG/ML IJ SOLN: 12.5000 mg | INTRAMUSCULAR | Status: DC | PRN

## 2019-06-07 MED ORDER — BUTORPHANOL TARTRATE 1 MG/ML IJ SOLN
1.0000 mg | INTRAMUSCULAR | Status: DC | PRN
  Administered 2019-06-07 (×2): 1 mg via INTRAVENOUS
  Filled 2019-06-07 (×2): qty 1

## 2019-06-07 MED ORDER — FENTANYL 2.5 MCG/ML W/ROPIVACAINE 0.15% IN NS 100 ML EPIDURAL (ARMC)
EPIDURAL | Status: AC
  Filled 2019-06-07: qty 100

## 2019-06-07 MED ORDER — MISOPROSTOL 200 MCG PO TABS
ORAL_TABLET | ORAL | Status: AC
  Filled 2019-06-07: qty 4

## 2019-06-07 NOTE — H&P (Signed)
Obstetrics Admission History & Physical   Scheduled Induction   HPI:  28 y.o. G1P0 @ [redacted]w[redacted]d (05/31/2019, by Ultrasound). Admitted on 06/07/2019:   Patient Active Problem List   Diagnosis Date Noted  . Encounter for planned induction of labor 06/07/2019  . Hemoglobin C trait (Zeigler) 12/28/2018  . Obesity in pregnancy 12/28/2018  . Tenosynovitis of wrist 11/30/2018  . Supervision of normal pregnancy, antepartum 11/15/2018     Presents for a postdates labor induction. She is feeling occasional contractions. She has not had any vaginal bleeding or loss of fluid. Her baby is moving well. She has mild edema in BLE. No other complaints.  Prenatal care at: at [facility]. Pregnancy complicated by obesity.  ROS: A review of systems was performed and negative, except as stated in the above HPI.  PMHx: History reviewed. No pertinent past medical history.   PSHx:  Past Surgical History:  Procedure Laterality Date  . NO PAST SURGERIES    . NO PAST SURGERIES     Medications:  Medications Prior to Admission  Medication Sig Dispense Refill Last Dose  . Prenatal Vit-Fe Fumarate-FA (MULTIVITAMIN-PRENATAL) 27-0.8 MG TABS tablet Take 1 tablet by mouth daily at 12 noon.   06/07/2019 at Unknown time   Allergies: is allergic to amoxicillin.   OBHx:  OB History  Gravida Para Term Preterm AB Living  1            SAB TAB Ectopic Multiple Live Births               # Outcome Date GA Lbr Len/2nd Weight Sex Delivery Anes PTL Lv  1 Current            FHx: Family History  Problem Relation Age of Onset  . Hypertension Mother   . Breast cancer Neg Hx   . Colon cancer Neg Hx   . Ovarian cancer Neg Hx   . Diabetes Neg Hx    Soc Hx: Former smoker, Alcohol: none and Recreational drug use: none  Objective:   Vitals:   06/07/19 0832  BP: 127/70  Pulse: 94  Resp: 18  Temp: 98.2 F (36.8 C)   Constitutional: Well nourished, well developed female in no acute distress.  HEENT: normal Skin: Warm and  dry.  Cardiovascular: Regular rate and rhythm.   Extremity: trace to 1+ bilateral pedal edema Respiratory: Clear to auscultation bilaterally. Normal respiratory effort Abdomen: gravid, non-tender Neuro: Cranial nerves grossly intact Psych: Alert and Oriented x3. No memory deficits. Normal mood and affect.  MS: normal bilateral lower extremity ROM/strength/stability.  Pelvic exam: is not limited by body habitus External Genitalia, Bartholin's glands, Urethra, Skene's glands: within normal limits Vagina: within normal limits and with no blood in the vault Cervix: 3/80/-1 Uterus: Spontaneous uterine activity  Adnexa: not evaluated  EFM:FHR: 130 bpm, variability: moderate,  accelerations:  Present,  decelerations:  Present Variable Toco: occasional contractions   Perinatal info:  Blood type: A positive Rubella - Immune Varicella - Immune TDaP Given during third trimester of this pregnancy on 03/29/2019 RPR NR / HIV Neg/ HBsAg Neg   Assessment & Plan:   28 y.o. G1P0 @ [redacted]w[redacted]d, Admitted on 06/07/2019 for a postdates induction of labor.    Begin induction with Pitocin.  Observe for cervical change, Fetal Wellbeing Reassuring, Epidural when ready, AROM when Appropriate and GBS status negative, treat as needed.  Avel Sensor, CNM [facility], [facility] 06/07/2019  9:18 AM

## 2019-06-07 NOTE — Progress Notes (Signed)
Subjective:  Uncomfortable with contractions, requesting dose of Stadol.  Objective:   Vitals: Blood pressure 125/67, pulse 78, temperature 98.8 F (37.1 C), temperature source Oral, resp. rate 18, height 5\' 9"  (1.753 m), weight 133 kg, last menstrual period 08/14/2018, SpO2 92 %. General: NAD, coping with contractions Abdomen: gravid, painful contractions Cervical Exam:  Dilation: 4 Effacement (%): 90 Cervical Position: Posterior Station: 0 Exam by:: Deeann Saint CNM  FHT: Baseline 130 bpm, moderate variability, accelerations present, decelerations: present, variable Toco: Contractions every 2.5-5 minutes, duration 60-70 seconds, intensity: mild to moderate  Results for orders placed or performed during the hospital encounter of 06/07/19 (from the past 24 hour(s))  Type and screen     Status: None   Collection Time: 06/07/19  8:49 AM  Result Value Ref Range   ABO/RH(D) A POS    Antibody Screen NEG    Sample Expiration      06/10/2019,2359 Performed at [facility], [address] [place], [place] [address]   CBC     Status: Abnormal   Collection Time: 06/07/19  8:50 AM  Result Value Ref Range   WBC 8.3 4.0 - 10.5 K/uL   RBC 4.17 3.87 - 5.11 MIL/uL   Hemoglobin 10.5 (L) 12.0 - 15.0 g/dL   HCT 31.1 (L) 36.0 - 46.0 %   MCV 74.6 (L) 80.0 - 100.0 fL   MCH 25.2 (L) 26.0 - 34.0 pg   MCHC 33.8 30.0 - 36.0 g/dL   RDW 15.2 11.5 - 15.5 %   Platelets 202 150 - 400 K/uL   nRBC 0.0 0.0 - 0.2 %  RPR     Status: None   Collection Time: 06/07/19  8:50 AM  Result Value Ref Range   RPR Ser Ql NON REACTIVE NON REACTIVE    Assessment:   28 y.o. G1P0 [redacted]w[redacted]d here for a postdates induction of labor  Plan:   1) Labor - Continue to titrate Pitocin. AROM with clear fluid, IUPC placed  2) Fetus - Category II tracing, fetal well-being reassuring, continue to monitor  3) Pain management: Stadol; epidural when ready.  Avel Sensor, CNM

## 2019-06-07 NOTE — Anesthesia Procedure Notes (Signed)
Epidural Patient location during procedure: OB Start time: 06/07/2019 9:44 PM End time: 06/07/2019 9:48 PM  Staffing Anesthesiologist: Alanah Sakuma, Precious Haws, MD Performed: anesthesiologist   Preanesthetic Checklist Completed: patient identified, site marked, surgical consent, pre-op evaluation, timeout performed, IV checked, risks and benefits discussed and monitors and equipment checked  Epidural Patient position: sitting Prep: ChloraPrep Patient monitoring: heart rate, continuous pulse ox and blood pressure Approach: midline Location: L3-L4 Injection technique: LOR saline  Needle:  Needle type: Tuohy  Needle gauge: 17 G Needle length: 9 cm and 9 Needle insertion depth: 9 cm Catheter type: closed end flexible Catheter size: 19 Gauge Catheter at skin depth: 15 cm Test dose: negative and 1.5% lidocaine with Epi 1:200 K  Assessment Sensory level: T10 Events: blood not aspirated, injection not painful, no injection resistance, negative IV test and no paresthesia  Additional Notes 2 attempts Pt. Evaluated and documentation done after procedure finished. Patient identified. Risks/Benefits/Options discussed with patient including but not limited to bleeding, infection, nerve damage, paralysis, failed block, incomplete pain control, headache, blood pressure changes, nausea, vomiting, reactions to medication both or allergic, itching and postpartum back pain. Confirmed with bedside nurse the patient's most recent platelet count. Confirmed with patient that they are not currently taking any anticoagulation, have any bleeding history or any family history of bleeding disorders. Patient expressed understanding and wished to proceed. All questions were answered. Sterile technique was used throughout the entire procedure. Please see nursing notes for vital signs. Test dose was given through epidural catheter and negative prior to continuing to dose epidural or start infusion. Warning signs of  high block given to the patient including shortness of breath, tingling/numbness in hands, complete motor block, or any concerning symptoms with instructions to call for help. Patient was given instructions on fall risk and not to get out of bed. All questions and concerns addressed with instructions to call with any issues or inadequate analgesia.   Patient tolerated the insertion well without immediate complications.Reason for block:procedure for pain

## 2019-06-07 NOTE — Anesthesia Preprocedure Evaluation (Signed)
Anesthesia Evaluation  Patient identified by MRN, date of birth, ID band Patient awake    Reviewed: Allergy & Precautions, H&P , NPO status , Patient's Chart, lab work & pertinent test results  History of Anesthesia Complications Negative for: history of anesthetic complications  Airway Mallampati: III  TM Distance: >3 FB Neck ROM: full    Dental  (+) Chipped   Pulmonary neg pulmonary ROS, former smoker,           Cardiovascular Exercise Tolerance: Good hypertension,      Neuro/Psych    GI/Hepatic negative GI ROS,   Endo/Other  Morbid obesity  Renal/GU   negative genitourinary   Musculoskeletal   Abdominal   Peds  Hematology negative hematology ROS (+)   Anesthesia Other Findings History reviewed. No pertinent past medical history.  Past Surgical History: No date: NO PAST SURGERIES No date: NO PAST SURGERIES  BMI    Body Mass Index: 43.30 kg/m      Reproductive/Obstetrics (+) Pregnancy                             Anesthesia Physical Anesthesia Plan  ASA: III  Anesthesia Plan: Epidural   Post-op Pain Management:    Induction:   PONV Risk Score and Plan:   Airway Management Planned:   Additional Equipment:   Intra-op Plan:   Post-operative Plan:   Informed Consent: I have reviewed the patients History and Physical, chart, labs and discussed the procedure including the risks, benefits and alternatives for the proposed anesthesia with the patient or authorized representative who has indicated his/her understanding and acceptance.       Plan Discussed with: Anesthesiologist  Anesthesia Plan Comments:         Anesthesia Quick Evaluation

## 2019-06-08 ENCOUNTER — Encounter
Admission: EM | Disposition: A | Discharge status: Home / Self Care | Payer: Self-pay | Source: Home / Self Care | Attending: Maternal Newborn

## 2019-06-08 LAB — CBC
HCT: 25.1 % — ABNORMAL LOW (ref 36.0–46.0)
Hemoglobin: 8.3 g/dL — ABNORMAL LOW (ref 12.0–15.0)
MCH: 24.9 pg — ABNORMAL LOW (ref 26.0–34.0)
MCHC: 33.1 g/dL (ref 30.0–36.0)
MCV: 75.1 fL — ABNORMAL LOW (ref 80.0–100.0)
Platelets: 144 10*3/uL — ABNORMAL LOW (ref 150–400)
RBC: 3.34 MIL/uL — ABNORMAL LOW (ref 3.87–5.11)
RDW: 15.4 % (ref 11.5–15.5)
WBC: 13.1 10*3/uL — ABNORMAL HIGH (ref 4.0–10.5)
nRBC: 0 % (ref 0.0–0.2)

## 2019-06-08 SURGERY — Surgical Case
Anesthesia: Epidural | Site: Abdomen

## 2019-06-08 MED ORDER — IBUPROFEN 800 MG PO TABS
800.0000 mg | ORAL_TABLET | Freq: Three times a day (TID) | ORAL | Status: DC
  Administered 2019-06-08 – 2019-06-10 (×6): 800 mg via ORAL
  Filled 2019-06-08 (×6): qty 1

## 2019-06-08 MED ORDER — SIMETHICONE 80 MG PO CHEW: 80.0000 mg | CHEWABLE_TABLET | ORAL | Status: DC | PRN

## 2019-06-08 MED ORDER — CLINDAMYCIN PHOSPHATE 900 MG/50ML IV SOLN
INTRAVENOUS | Status: AC
  Filled 2019-06-08: qty 50

## 2019-06-08 MED ORDER — FENTANYL CITRATE (PF) 100 MCG/2ML IJ SOLN
INTRAMUSCULAR | Status: AC
  Filled 2019-06-08: qty 2

## 2019-06-08 MED ORDER — SENNOSIDES-DOCUSATE SODIUM 8.6-50 MG PO TABS
2.0000 | ORAL_TABLET | ORAL | Status: DC
  Administered 2019-06-09: 2 via ORAL
  Filled 2019-06-08: qty 2

## 2019-06-08 MED ORDER — KETOROLAC TROMETHAMINE 30 MG/ML IJ SOLN: 30.0000 mg | Freq: Four times a day (QID) | INTRAMUSCULAR | Status: DC

## 2019-06-08 MED ORDER — BUPIVACAINE ON-Q PAIN PUMP (FOR ORDER SET NO CHG)
INJECTION | Status: DC
  Filled 2019-06-08 (×3): qty 1

## 2019-06-08 MED ORDER — ZOLPIDEM TARTRATE 5 MG PO TABS: 5.0000 mg | ORAL_TABLET | Freq: Every evening | ORAL | Status: DC | PRN

## 2019-06-08 MED ORDER — SOD CITRATE-CITRIC ACID 500-334 MG/5ML PO SOLN
ORAL | Status: AC
  Filled 2019-06-08: qty 30

## 2019-06-08 MED ORDER — SIMETHICONE 80 MG PO CHEW
80.0000 mg | CHEWABLE_TABLET | Freq: Three times a day (TID) | ORAL | Status: DC
  Administered 2019-06-08 – 2019-06-10 (×6): 80 mg via ORAL
  Filled 2019-06-08 (×6): qty 1

## 2019-06-08 MED ORDER — BUPIVACAINE HCL (PF) 0.5 % IJ SOLN
INTRAMUSCULAR | Status: AC
  Filled 2019-06-08: qty 30

## 2019-06-08 MED ORDER — MORPHINE SULFATE (PF) 0.5 MG/ML IJ SOLN
INTRAMUSCULAR | Status: DC | PRN
  Administered 2019-06-08: 3 mg via EPIDURAL

## 2019-06-08 MED ORDER — ONDANSETRON HCL 4 MG/2ML IJ SOLN: 4.0000 mg | Freq: Three times a day (TID) | INTRAMUSCULAR | Status: DC | PRN

## 2019-06-08 MED ORDER — ACETAMINOPHEN 325 MG PO TABS
650.0000 mg | ORAL_TABLET | Freq: Four times a day (QID) | ORAL | Status: AC
  Administered 2019-06-08 – 2019-06-09 (×3): 650 mg via ORAL
  Filled 2019-06-08 (×3): qty 2

## 2019-06-08 MED ORDER — BUPIVACAINE 0.25 % ON-Q PUMP DUAL CATH 400 ML
400.0000 mL | INJECTION | Status: DC
  Filled 2019-06-08: qty 400

## 2019-06-08 MED ORDER — KETAMINE HCL 50 MG/ML IJ SOLN
INTRAMUSCULAR | Status: DC | PRN
  Administered 2019-06-08: 50 mg via INTRAMUSCULAR
  Administered 2019-06-08: 30 mg via INTRAMUSCULAR
  Administered 2019-06-08: 50 mg via INTRAMUSCULAR

## 2019-06-08 MED ORDER — OXYCODONE HCL 5 MG/5ML PO SOLN
5.0000 mg | Freq: Once | ORAL | Status: DC | PRN
  Filled 2019-06-08: qty 5

## 2019-06-08 MED ORDER — FLEET ENEMA 7-19 GM/118ML RE ENEM: 1.0000 | ENEMA | Freq: Every day | RECTAL | Status: DC | PRN

## 2019-06-08 MED ORDER — WITCH HAZEL-GLYCERIN EX PADS: 1.0000 "application " | MEDICATED_PAD | CUTANEOUS | Status: DC | PRN

## 2019-06-08 MED ORDER — PRENATAL MULTIVITAMIN CH
1.0000 | ORAL_TABLET | Freq: Every day | ORAL | Status: DC
  Administered 2019-06-08 – 2019-06-09 (×2): 1 via ORAL
  Filled 2019-06-08 (×2): qty 1

## 2019-06-08 MED ORDER — KETAMINE HCL 50 MG/ML IJ SOLN
INTRAMUSCULAR | Status: AC
  Filled 2019-06-08: qty 10

## 2019-06-08 MED ORDER — FENTANYL CITRATE (PF) 100 MCG/2ML IJ SOLN: 25.0000 ug | INTRAMUSCULAR | Status: DC | PRN

## 2019-06-08 MED ORDER — MIDAZOLAM HCL 2 MG/2ML IJ SOLN
INTRAMUSCULAR | Status: AC
  Filled 2019-06-08: qty 2

## 2019-06-08 MED ORDER — GENTAMICIN SULFATE 40 MG/ML IJ SOLN
5.0000 mg/kg | INTRAVENOUS | Status: AC
  Administered 2019-06-08: 464.4 mg via INTRAVENOUS
  Filled 2019-06-08: qty 11.5

## 2019-06-08 MED ORDER — DIPHENHYDRAMINE HCL 25 MG PO CAPS: 25.0000 mg | ORAL_CAPSULE | Freq: Four times a day (QID) | ORAL | Status: DC | PRN

## 2019-06-08 MED ORDER — ACETAMINOPHEN 500 MG PO TABS
1000.0000 mg | ORAL_TABLET | Freq: Four times a day (QID) | ORAL | Status: DC
  Administered 2019-06-08 – 2019-06-09 (×3): 1000 mg via ORAL
  Filled 2019-06-08 (×3): qty 2

## 2019-06-08 MED ORDER — LACTATED RINGERS IV SOLN: INTRAVENOUS | Status: DC

## 2019-06-08 MED ORDER — KETOROLAC TROMETHAMINE 30 MG/ML IJ SOLN
30.0000 mg | Freq: Once | INTRAMUSCULAR | Status: AC
  Administered 2019-06-08: 30 mg via INTRAVENOUS
  Filled 2019-06-08: qty 1

## 2019-06-08 MED ORDER — MEPERIDINE HCL 25 MG/ML IJ SOLN: 6.2500 mg | INTRAMUSCULAR | Status: DC | PRN

## 2019-06-08 MED ORDER — LIDOCAINE HCL (PF) 2 % IJ SOLN
INTRAMUSCULAR | Status: AC
  Filled 2019-06-08: qty 10

## 2019-06-08 MED ORDER — MORPHINE SULFATE (PF) 0.5 MG/ML IJ SOLN
INTRAMUSCULAR | Status: AC
  Filled 2019-06-08: qty 10

## 2019-06-08 MED ORDER — COCONUT OIL OIL: 1.0000 "application " | TOPICAL_OIL | Status: DC | PRN

## 2019-06-08 MED ORDER — OXYCODONE HCL 5 MG PO TABS: 5.0000 mg | ORAL_TABLET | ORAL | Status: DC | PRN

## 2019-06-08 MED ORDER — LIDOCAINE HCL (PF) 2 % IJ SOLN
INTRAMUSCULAR | Status: DC | PRN
  Administered 2019-06-08: 100 mg via INTRADERMAL
  Administered 2019-06-08: 100 mg via EPIDURAL
  Administered 2019-06-08 (×3): 100 mg via INTRADERMAL
  Administered 2019-06-08: 100 mg via EPIDURAL

## 2019-06-08 MED ORDER — CLINDAMYCIN PHOSPHATE 900 MG/50ML IV SOLN
900.0000 mg | INTRAVENOUS | Status: AC
  Administered 2019-06-08: 900 mg via INTRAVENOUS

## 2019-06-08 MED ORDER — KETOROLAC TROMETHAMINE 30 MG/ML IJ SOLN
30.0000 mg | Freq: Four times a day (QID) | INTRAMUSCULAR | Status: DC
  Administered 2019-06-08: 30 mg via INTRAVENOUS
  Filled 2019-06-08: qty 1

## 2019-06-08 MED ORDER — NALBUPHINE HCL 10 MG/ML IJ SOLN: 5.0000 mg | Freq: Once | INTRAMUSCULAR | Status: DC | PRN

## 2019-06-08 MED ORDER — NALOXONE HCL 4 MG/10ML IJ SOLN
1.0000 ug/kg/h | INTRAVENOUS | Status: DC | PRN
  Filled 2019-06-08: qty 5

## 2019-06-08 MED ORDER — MIDAZOLAM HCL 2 MG/2ML IJ SOLN
INTRAMUSCULAR | Status: DC | PRN
  Administered 2019-06-08: 2 mg via INTRAVENOUS

## 2019-06-08 MED ORDER — DIPHENHYDRAMINE HCL 50 MG/ML IJ SOLN: 12.5000 mg | INTRAMUSCULAR | Status: DC | PRN

## 2019-06-08 MED ORDER — DIBUCAINE (PERIANAL) 1 % EX OINT: 1.0000 "application " | TOPICAL_OINTMENT | CUTANEOUS | Status: DC | PRN

## 2019-06-08 MED ORDER — OXYCODONE HCL 5 MG PO TABS: 5.0000 mg | ORAL_TABLET | Freq: Once | ORAL | Status: DC | PRN

## 2019-06-08 MED ORDER — FENTANYL CITRATE (PF) 100 MCG/2ML IJ SOLN
INTRAMUSCULAR | Status: DC | PRN
  Administered 2019-06-08: 100 ug via EPIDURAL
  Administered 2019-06-08 (×4): 50 ug via INTRAVENOUS

## 2019-06-08 MED ORDER — DIPHENHYDRAMINE HCL 25 MG PO CAPS: 25.0000 mg | ORAL_CAPSULE | ORAL | Status: DC | PRN

## 2019-06-08 MED ORDER — NALOXONE HCL 0.4 MG/ML IJ SOLN: 0.4000 mg | INTRAMUSCULAR | Status: DC | PRN

## 2019-06-08 MED ORDER — NALBUPHINE HCL 10 MG/ML IJ SOLN: 5.0000 mg | INTRAMUSCULAR | Status: DC | PRN

## 2019-06-08 MED ORDER — METOCLOPRAMIDE HCL 10 MG PO TABS
5.0000 mg | ORAL_TABLET | Freq: Three times a day (TID) | ORAL | Status: DC
  Administered 2019-06-08 – 2019-06-10 (×8): 5 mg via ORAL
  Filled 2019-06-08 (×8): qty 1

## 2019-06-08 MED ORDER — MENTHOL 3 MG MT LOZG
1.0000 | LOZENGE | OROMUCOSAL | Status: DC | PRN
  Filled 2019-06-08: qty 9

## 2019-06-08 MED ORDER — BISACODYL 10 MG RE SUPP: 10.0000 mg | Freq: Every day | RECTAL | Status: DC | PRN

## 2019-06-08 MED ORDER — OXYTOCIN 40 UNITS IN NORMAL SALINE INFUSION - SIMPLE MED
2.5000 [IU]/h | INTRAVENOUS | Status: AC
  Administered 2019-06-08: 2.5 [IU]/h via INTRAVENOUS
  Filled 2019-06-08: qty 1000

## 2019-06-08 MED ORDER — MORPHINE SULFATE (PF) 2 MG/ML IV SOLN: 1.0000 mg | INTRAVENOUS | Status: DC | PRN

## 2019-06-08 MED ORDER — SODIUM CHLORIDE 0.9% FLUSH: 3.0000 mL | INTRAVENOUS | Status: DC | PRN

## 2019-06-08 MED ORDER — SIMETHICONE 80 MG PO CHEW
80.0000 mg | CHEWABLE_TABLET | ORAL | Status: DC
  Administered 2019-06-09: 80 mg via ORAL
  Filled 2019-06-08: qty 1

## 2019-06-08 MED ORDER — BUPIVACAINE HCL 0.5 % IJ SOLN
INTRAMUSCULAR | Status: DC | PRN
  Administered 2019-06-08: 10 mL

## 2019-06-08 MED ORDER — OXYCODONE HCL 5 MG PO TABS: 5.0000 mg | ORAL_TABLET | Freq: Four times a day (QID) | ORAL | Status: DC | PRN

## 2019-06-08 SURGICAL SUPPLY — 38 items
CANISTER SUCT 3000ML PPV (MISCELLANEOUS) ×2 IMPLANT
CATH KIT ON-Q SILVERSOAK 5 (CATHETERS) IMPLANT
CATH KIT ON-Q SILVERSOAK 5IN (CATHETERS) ×4 IMPLANT
CHLORAPREP W/TINT 26 (MISCELLANEOUS) ×4 IMPLANT
DERMABOND ADVANCED (GAUZE/BANDAGES/DRESSINGS) ×1
DERMABOND ADVANCED .7 DNX12 (GAUZE/BANDAGES/DRESSINGS) ×1 IMPLANT
DRSG OPSITE POSTOP 4X10 (GAUZE/BANDAGES/DRESSINGS) ×2 IMPLANT
ELECT CAUTERY BLADE 6.4 (BLADE) ×2 IMPLANT
ELECT REM PT RETURN 9FT ADLT (ELECTROSURGICAL) ×2
ELECTRODE REM PT RTRN 9FT ADLT (ELECTROSURGICAL) ×1 IMPLANT
EXTRACTOR VACUUM KIWI (MISCELLANEOUS) ×1 IMPLANT
GLOVE BIOGEL PI IND STRL 6.5 (GLOVE) ×2 IMPLANT
GLOVE BIOGEL PI IND STRL 8.5 (GLOVE) IMPLANT
GLOVE BIOGEL PI INDICATOR 6.5 (GLOVE) ×1
GLOVE BIOGEL PI INDICATOR 8.5 (GLOVE) ×3
GLOVE SKINSENSE NS SZ6.5 (GLOVE) ×5
GLOVE SKINSENSE STRL SZ6.5 (GLOVE) IMPLANT
GOWN STRL REUS W/ TWL LRG LVL3 (GOWN DISPOSABLE) ×1 IMPLANT
GOWN STRL REUS W/ TWL XL LVL3 (GOWN DISPOSABLE) ×2 IMPLANT
GOWN STRL REUS W/TWL LRG LVL3 (GOWN DISPOSABLE) ×1
GOWN STRL REUS W/TWL XL LVL3 (GOWN DISPOSABLE) ×2
NS IRRIG 1000ML POUR BTL (IV SOLUTION) ×2 IMPLANT
PACK C SECTION AR (MISCELLANEOUS) ×2 IMPLANT
PAD OB MATERNITY 4.3X12.25 (PERSONAL CARE ITEMS) ×3 IMPLANT
PAD PREP 24X41 OB/GYN DISP (PERSONAL CARE ITEMS) ×2 IMPLANT
PENCIL SMOKE ULTRAEVAC 22 CON (MISCELLANEOUS) ×2 IMPLANT
RETAINER VISCERA MED (MISCELLANEOUS) ×1 IMPLANT
RETRACTOR TRAXI PANNICULUS (MISCELLANEOUS) IMPLANT
RTRCTR C-SECT PINK 25CM LRG (MISCELLANEOUS) ×1 IMPLANT
SPONGE LAP 18X18 RF (DISPOSABLE) ×3 IMPLANT
SUT CHROMIC 0 CT 1 (SUTURE) ×1 IMPLANT
SUT MNCRL AB 4-0 PS2 18 (SUTURE) ×2 IMPLANT
SUT PDS AB 1 TP1 96 (SUTURE) ×1 IMPLANT
SUT PLAIN 3-0 (SUTURE) ×2 IMPLANT
SUT VIC AB 0 CT1 36 (SUTURE) ×9 IMPLANT
SUT VIC AB 2-0 CT1 36 (SUTURE) ×2 IMPLANT
SYR 30ML LL (SYRINGE) ×4 IMPLANT
TRAXI PANNICULUS RETRACTOR (MISCELLANEOUS) ×1

## 2019-06-08 NOTE — Progress Notes (Signed)
Progress Note  Called by shift coordinator nurse Jonetta Speak on L&D to respond to non-reassuring fetal tracing of laboring patient cared for by [facility] as both their midwife and physician on call were noted to be involved in other patient care and unable to respond.  Patient was noted to have recurrent late and variable decelerations beginning at 22:20 (shortly after receiving epidural placement at ~ 2200).  Position changes performed with intermittent resolution followed by another bout of decelerations.  Internal fetal monitors were placed at 23:28 with prolonged deceleration noted from 90s lasting ~ 10 minutes. Patient changed to hands and knees positioning with improved response, as well as pitocin discontinued and a dose of terbutaline was administered. Fetal scalp electrode had been dislodged during position turning. At the time of my arrival, decelerations had just begun to resolve (~23:56).  Patient back in left lateral tilt. Midwife now present in the room holding external FHR monitor. Discussion had with patient regarding events that had occurred.  Discussed possibility of need for C-section if fetal tracing continued to be non-reassuring.  During decelerations, cervical exam noted no significant change (6.5/90/0). Will allow fetus time to recover for now as improvement is noted in fetal tracing. FHR currently 135 bpm, moderate variability. Patient and family member note understanding. Will continue to monitor closely.     Rubie Maid, MD [facility]

## 2019-06-08 NOTE — Transfer of Care (Signed)
Immediate Anesthesia Transfer of Care Note  Patient: Natasha Alvarez  Procedure(s) Performed: CESAREAN SECTION (N/A Abdomen)  Patient Location: PACU and Mother/Baby  Anesthesia Type:Epidural  Level of Consciousness: drowsy  Airway & Oxygen Therapy: Patient Spontanous Breathing  Post-op Assessment: Report given to RN and Post -op Vital signs reviewed and stable  Post vital signs: Reviewed and stable  Last Vitals:  Vitals Value Taken Time  BP 134/72 06/08/19 0653  Temp    Pulse 102 06/08/19 0653  Resp 22 06/08/19 0653  SpO2 100 % 06/08/19 0653    Last Pain:  Vitals:   06/08/19 0650  TempSrc:   PainSc: 8       Patients Stated Pain Goal: 0 (93/71/69 6789)  Complications: No apparent anesthesia complications

## 2019-06-08 NOTE — Progress Notes (Signed)
Subjective:  Not having pain with epidural. Only able to position on left side due to FHR decelerations when attempting to turn to right side. Pillows provided to increase maternal comfort.  Objective:   Vitals: Blood pressure (!) 142/70, pulse (!) 102, temperature 99.1 F (37.3 C), temperature source Oral, resp. rate 18, height 5\' 9"  (1.753 m), weight 133 kg, last menstrual period 08/14/2018, SpO2 94 %. General: NAD Abdomen: gravid, non-tender Cervical Exam:  Dilation: 7 Effacement (%): 90 Station: 0 Exam by:: Natasha Alvarez CNM  FHT: Baseline 135 bpm, variability moderate to marked, accelerations present, declerations present, occasional early, variable and late Toco: Contractions every 3-3.5 minutes, Duration 60-80 seconds, intensity mild to moderate  Results for orders placed or performed during the hospital encounter of 06/07/19 (from the past 24 hour(s))  Type and screen     Status: None   Collection Time: 06/07/19  8:49 AM  Result Value Ref Range   ABO/RH(D) A POS    Antibody Screen NEG    Sample Expiration      06/10/2019,2359 Performed at [facility], [address] [place], [place] [address]   CBC     Status: Abnormal   Collection Time: 06/07/19  8:50 AM  Result Value Ref Range   WBC 8.3 4.0 - 10.5 K/uL   RBC 4.17 3.87 - 5.11 MIL/uL   Hemoglobin 10.5 (L) 12.0 - 15.0 g/dL   HCT 31.1 (L) 36.0 - 46.0 %   MCV 74.6 (L) 80.0 - 100.0 fL   MCH 25.2 (L) 26.0 - 34.0 pg   MCHC 33.8 30.0 - 36.0 g/dL   RDW 15.2 11.5 - 15.5 %   Platelets 202 150 - 400 K/uL   nRBC 0.0 0.0 - 0.2 %  RPR     Status: None   Collection Time: 06/07/19  8:50 AM  Result Value Ref Range   RPR Ser Ql NON REACTIVE NON REACTIVE    Assessment:   28 y.o. G1P0 [redacted]w[redacted]d postdates induction of labor.  Plan:   1) Labor - patient is contracting spontaneously. Strongly desires vaginal delivery if possible.  2) Fetus - Category II, continue to monitor. MD immediately available if needed.  Avel Sensor, CNM 06/08/2019  2:55 AM

## 2019-06-08 NOTE — Lactation Note (Signed)
This note was copied from a baby's chart. Lactation Consultation Note  Patient Name: Natasha Alvarez [id number] Date: 06/08/2019   Mom had Brayden skin to skin when entered room. He was slightly stirring.  Assisted mom with better pillow support and using biological hold demonstrating sandwiching of the breast.  Got Brayden to latch with minimal assistance.  He began strong rhythmic sucking which mom could feel as strong tugs.  Mom denies any breast or nipple pain.  Kept encouraging mom to keep him close because she would occasionally let him slip to tip of nipple.  He fed vigorously for 14 minutes and then came off on his own appearing satiated.  Reviewed newborn stomach size, feeding cues, supply and demand, normal course of lactation and routine newborn feeding patterns.  Lactation name and number written on white board and encouraged to call with any questions, concerns or assistance.      Maternal Data    Feeding Feeding Type: Breast Fed  LATCH Score                   Interventions    Lactation Tools Discussed/Used     Consult Status      Jarold Motto 06/08/2019, 6:45 PM

## 2019-06-08 NOTE — Op Note (Signed)
Cesarean Section Procedure Note 06/08/19  Pre-operative Diagnosis:  1. Arrest of Dilation  2. Category II fetal heart rate tracing  3. [redacted] week gestation Post-operative Diagnosis:   1. Arrest of Dilation  2. Category II fetal heart rate tracing  3. [redacted] week gestation  4. Impacted fetal head Procedure: Primary Low Transverse Cesarean Section with a T- extension of incision Surgeon: Adrian Prows MD   Assistant(s): Miguel Rota CNM - No other skilled surgical assistant available. Anesthesia: Spinal Estimated Blood Loss: 4315 cc Complications: None; patient tolerated the procedure well.   Disposition: PACU - hemodynamically stable. Condition: stable   Findings: A female infant in the cephalic presentation. Amniotic fluid - clear  Birth weight: 7 lbs 6oz Apgars of 9 and 9.  Intact placenta with a three-vessel cord. Grossly normal uterus, tubes and ovaries bilaterally. No intraabdominal adhesions were noted.   Procedure Details    The patient was taken to operating room, identified as the correct patient and the procedure verified as C-Section Delivery. A time out was held and the above information confirmed. After induction of anesthesia, the patient was draped and prepped in the usual sterile manner. A Pfannenstiel incision was made and carried down through the subcutaneous tissue to the fascia. Fascial incision was made and extended transversely with the Mayo scissors. The fascia was separated from the underlying rectus tissue superiorly and inferiorly. The peritoneum was identified and entered bluntly. Peritoneal incision was extended longitudinally.An Alexis retractor was placed.  A low transverse hysterotomy was made. The fetus was delivered atraumatically with assistance from a vaginal hand, a T-shape extension of the uterine incision, and the flat kiwi vacuum with one pull (no pop offs). Delivery was complicated by occiput posterior position and fetal head impaction.   The umbilical  cord was clamped x2 and cut and the infant was handed to the awaiting pediatricians. Infant was vigorous. The placenta was removed intact and appeared normal with a 3-vessel cord. The uterus was exteriorized and cleared of all clot and debris. The T-shape extension was closed with three layers of 0-vicryl  running sutures.  The hysterotomy was then repaired with 0-vicryl with a running stitch. A second imbricating layer was placed with the same suture. Excellent hemostasis was observed.  The uterus was returned to the abdomen. The SurgiFish Viscera Retainer was used to replace abdominal bowel. The pelvis was cleared of clots and debris and again, excellent hemostasis was noted. The peritoneum was closed with a running stitch of 2-0 Vicryl. The SurgiFish was removed as the peritoneum was closed. The On Q Pain pump System was then placed.  Trocars were placed through the abdominal wall into the subfascial space and these were used to thread the silver soaker cathaters into place.The rectus muscles were inspected and were hemostatic. The rectus fascia was then reapproximated with running sutures of 1- looped PDS, with careful placement not to incorporate the cathaters. Subcutaneous tissues are then irrigated with saline and hemostasis observed. The subcutaneous fat was approximated with 3-0 plain and a running stitch.  The skin was closed with 4-0 monocryl suture in a subcuticular fashion followed by skin adhesive. The cathaters are flushed each with 5 mL of Bupivicaine and stabilized into place with dressing. Instrument, sponge, and needle counts were correct prior to the abdominal closure and at the conclusion of the case.  The patient tolerated the procedure well and was transferred to the recovery room in stable condition.   Homero Fellers MD [facility], [facility] 06/08/19 7:07 AM

## 2019-06-08 NOTE — Progress Notes (Signed)
Patient has be arrested at th same duration for more than 4 hours. Category II fetal heart rate tracing. No meconium. Discussed options with patient. Will move for cesarean section. Discussed surgical consent in detail with the patient.   Natasha Prows MD [facility], [facility] 06/08/2019 4:12 AM

## 2019-06-08 NOTE — Discharge Summary (Signed)
OB Discharge Summary     Patient Name: Natasha Alvarez DOB: [birth year] MRN: [id number]  Date of admission: 06/07/2019 Delivering MD: Dr Jerene Pitch  Date of Delivery: 06/08/2019  Date of discharge: 06/10/2019  Admitting diagnosis: 41 wks preg induction Intrauterine pregnancy: [redacted]w[redacted]d     Secondary diagnosis: None     Discharge diagnosis: Term Pregnancy Delivered, Post partum hemorrhage and Reasons for cesarean section  Arrest of Dilation, Non-reassuring FHR and Malpresentation Occiput posterior                         Hospital course:  Induction of Labor With Cesarean Section  28 y.o. yo G1P1001 at [redacted]w[redacted]d was admitted to the hospital 06/07/2019 for induction of labor. Patient had a labor course significant for arrest of dilation at 7 cm. The patient went for cesarean section due to Arrest of Dilation, and delivered a Viable infant,06/08/2019  Membrane Rupture Time/Date: 8:45 PM ,06/07/2019   Details of operation can be found in separate operative Note.  Patient had an uncomplicated postpartum course. She is ambulating, tolerating a regular diet, passing flatus, and urinating well.  Patient is discharged home in stable condition on 06/10/19.                                                                                                   Post partum procedures:none  Complications: None  Physical exam on 06/10/2019: Vitals:   06/09/19 1527 06/09/19 2300 06/10/19 0807 06/10/19 0810  BP: 120/71 (!) 147/82 136/72 136/72  Pulse: (!) 109 (!) 112 (!) 102 (!) 102  Resp: 18 18 20 18   Temp: 98.3 F (36.8 C) 98.7 F (37.1 C) 98.7 F (37.1 C) 98.7 F (37.1 C)  TempSrc: Oral Oral Oral Oral  SpO2: 98% 100% 95% 95%  Weight:      Height:       General: alert, cooperative and no distress Lochia: appropriate Uterine Fundus: firm Incision: Healing well with no significant drainage, No significant erythema, Dressing is clean, dry, and intact DVT Evaluation: No evidence of DVT seen on physical  exam. Negative Homan's sign. No cords or calf tenderness.  Labs: Lab Results  Component Value Date   WBC 13.6 (H) 06/09/2019   HGB 8.4 (L) 06/09/2019   HCT 25.2 (L) 06/09/2019   MCV 75.2 (L) 06/09/2019   PLT 180 06/09/2019   CMP Latest Ref Rng & Units 09/16/2015  Glucose 65 - 99 mg/dL 95  BUN 6 - 20 mg/dL 12  Creatinine 09/18/2015 - 8.01 mg/dL 6.55  Sodium 3.74 - 827 mmol/L 140  Potassium 3.5 - 5.1 mmol/L 3.8  Chloride 101 - 111 mmol/L 109  CO2 22 - 32 mmol/L 25  Calcium 8.9 - 10.3 mg/dL 9.2  Total Protein 6.5 - 8.1 g/dL 7.2  Total Bilirubin 0.3 - 1.2 mg/dL 0.7  Alkaline Phos 38 - 126 U/L 50  AST 15 - 41 U/L 17  ALT 14 - 54 U/L 21    Discharge instruction: per After Visit Summary.  Medications:  Allergies as of 06/10/2019  Reactions   Amoxicillin Anaphylaxis      Medication List    TAKE these medications   multivitamin-prenatal 27-0.8 MG Tabs tablet Take 1 tablet by mouth daily at 12 noon.   oxyCODONE-acetaminophen 5-325 MG tablet Commonly known as: Percocet Take 1 tablet by mouth every 4 (four) hours as needed for moderate pain or severe pain.       Diet: routine diet  Activity: Advance as tolerated. Pelvic rest for 6 weeks.   Outpatient follow up: Follow-up Information    Schuman, Christanna R, MD Follow up in 1 week(s).   Specialty: Obstetrics and Gynecology Contact information: [place] [address] [phone]             Postpartum contraception: Nexplanon Rhogam Given postpartum: no Rubella vaccine given postpartum: no Varicella vaccine given postpartum: no TDaP given antepartum or postpartum: Yes  Newborn Data: Live born female  Birth Weight: 7 lb 5.8 oz (3340 g) APGAR: 9, 9  Newborn Delivery   Birth date/time: 06/08/2019 05:07:00 Delivery type: C-Section, Low Transverse C-section categorization: Primary       Baby Feeding: Bottle and Breast  Disposition:home with mother  SIGNED: Hoyt Koch,  MD 06/10/2019 9:50 AM

## 2019-06-08 NOTE — Progress Notes (Signed)
POD 0 s/p primary LTCS with T extension Subjective:  Awake and tolerating sips of fluids.   Objective:  Blood pressure 108/60, pulse (!) 107, temperature 99.1 F (37.3 C), temperature source Oral, resp. rate (!) 22, height 5\' 9"  (1.753 m), weight 133 kg, last menstrual period 08/14/2018, SpO2 95 %, unknown if currently breastfeeding. Adequate urine output General: Awake and alert, in NAD Pulmonary: no increased work of breathing/ CTAB Heart: Mild tachycardia, no  murmur Lochia: small amount Incision: Dressing C&D&I. ON Q intact Extremities: SCDs on  Results for orders placed or performed during the hospital encounter of 06/07/19 (from the past 72 hour(s))  Type and screen     Status: None   Collection Time: 06/07/19  8:49 AM  Result Value Ref Range   ABO/RH(D) A POS    Antibody Screen NEG    Sample Expiration      06/10/2019,2359 Performed at [facility], [address] [place], [place] [address]   CBC     Status: Abnormal   Collection Time: 06/07/19  8:50 AM  Result Value Ref Range   WBC 8.3 4.0 - 10.5 K/uL   RBC 4.17 3.87 - 5.11 MIL/uL   Hemoglobin 10.5 (L) 12.0 - 15.0 g/dL   HCT 31.1 (L) 36.0 - 46.0 %   MCV 74.6 (L) 80.0 - 100.0 fL   MCH 25.2 (L) 26.0 - 34.0 pg   MCHC 33.8 30.0 - 36.0 g/dL   RDW 15.2 11.5 - 15.5 %   Platelets 202 150 - 400 K/uL   nRBC 0.0 0.0 - 0.2 %    Comment: Performed at [facility], [address] [place], [place] [address]  RPR     Status: None   Collection Time: 06/07/19  8:50 AM  Result Value Ref Range   RPR Ser Ql NON REACTIVE NON REACTIVE    Comment: Performed at [facility] [address] [place],  [address]     Assessment:   28 y.o. G1P1001 postoperativeday # 0/  6 hours postop-stable.  Transfer to [facility] unit  Continue postoperative/ postpartum care  OOB later today   Plan:  1) Mild tachycardia with EBL of 1415 ml. Will get CBC this afternoon. -   2) A POS/ RI/ VI  3) TDAP -given AP  4)  Breast/Bottle/Contraception-pills  Dalia Heading, CNM

## 2019-06-08 NOTE — Anesthesia Post-op Follow-up Note (Signed)
Anesthesia QCDR form completed.        

## 2019-06-08 NOTE — Discharge Instructions (Signed)
Breastfeeding  Postpartum Baby Blues The postpartum period begins right after the birth of a baby. During this time, there is often a lot of joy and excitement. It is also a time of many changes in the life of the parents. No matter how many times a mother gives birth, each child brings new challenges to the family, including different ways of relating to one another. It is common to have feelings of excitement along with confusing changes in moods, emotions, and thoughts. You may feel happy one minute and sad or stressed the next. These feelings of sadness usually happen in the period right after you have your baby, and they go away within a week or two. This is called the "baby blues." What are the causes? There is no known cause of baby blues. It is likely caused by a combination of factors. However, changes in hormone levels after childbirth are believed to trigger some of the symptoms. Other factors that can play a role in these mood changes include: Lack of sleep. Stressful life events, such as poverty, caring for a loved one, or death of a loved one. Genetics. What are the signs or symptoms? Symptoms of this condition include: Brief changes in mood, such as going from extreme happiness to sadness. Decreased concentration. Difficulty sleeping. Crying spells and tearfulness. Loss of appetite. Irritability. Anxiety. If the symptoms of baby blues last for more than 2 weeks or become more severe, you may have postpartum depression. How is this diagnosed? This condition is diagnosed based on an evaluation of your symptoms. There are no medical or lab tests that lead to a diagnosis, but there are various questionnaires that a health care provider may use to identify women with the baby blues or postpartum depression. How is this treated? Treatment is not needed for this condition. The baby blues usually go away on their own in 1-2 weeks. Social support is often all that is needed. You will be  encouraged to get adequate sleep and rest. Follow these instructions at home: Lifestyle     Get as much rest as you can. Take a nap when the baby sleeps. Exercise regularly as told by your health care provider. Some women find yoga and walking to be helpful. Eat a balanced and nourishing diet. This includes plenty of fruits and vegetables, whole grains, and lean proteins. Do little things that you enjoy. Have a cup of tea, take a bubble bath, read your favorite magazine, or listen to your favorite music. Avoid alcohol. Ask for help with household chores, cooking, grocery shopping, or running errands. Do not try to do everything yourself. Consider hiring a postpartum doula to help. This is a professional who specializes in providing support to new mothers. Try not to make any major life changes during pregnancy or right after giving birth. This can add stress. General instructions Talk to people close to you about how you are feeling. Get support from your partner, family members, friends, or other new moms. You may want to join a support group. Find ways to cope with stress. This may include: Writing your thoughts and feelings in a journal. Spending time outside. Spending time with people who make you laugh. Try to stay positive in how you think. Think about the things you are grateful for. Take over-the-counter and prescription medicines only as told by your health care provider. Let your health care provider know if you have any concerns. Keep all postpartum visits as told by your health care provider. This  is important. Contact a health care provider if: Your baby blues do not go away after 2 weeks. Get help right away if: You have thoughts of taking your own life (suicidal thoughts). You think you may harm the baby or other people. You see or hear things that are not there (hallucinations). Summary After giving birth, you may feel happy one minute and sad or stressed the next.  Feelings of sadness that happen right after the baby is born and go away after a week or two are called the "baby blues." You can manage the baby blues by getting enough rest, eating a healthy diet, exercising, spending time with supportive people, and finding ways to cope with stress. If feelings of sadness and stress last longer than 2 weeks or get in the way of caring for your baby, talk to your health care provider. This may mean you have postpartum depression. This information is not intended to replace advice given to you by your health care provider. Make sure you discuss any questions you have with your health care provider. Document Released: 05/21/2004 Document Revised: 12/09/2018 Document Reviewed: 10/13/2016 Elsevier Patient Education  2020 [employer].  Choosing to breastfeed is one of the best decisions you can make for yourself and your baby. A change in hormones during pregnancy causes your breasts to make breast milk in your milk-producing glands. Hormones prevent breast milk from being released before your baby is born. They also prompt milk flow after birth. Once breastfeeding has begun, thoughts of your baby, as well as his or her sucking or crying, can stimulate the release of milk from your milk-producing glands. Benefits of breastfeeding Research shows that breastfeeding offers many health benefits for infants and mothers. It also offers a cost-free and convenient way to feed your baby. For your baby  Your first milk (colostrum) helps your baby's digestive system to function better.  Special cells in your milk (antibodies) help your baby to fight off infections.  Breastfed babies are less likely to develop asthma, allergies, obesity, or type 2 diabetes. They are also at lower risk for sudden infant death syndrome (SIDS).  Nutrients in breast milk are better able to meet your babys needs compared to infant formula.  Breast milk improves your baby's brain development. For  you  Breastfeeding helps to create a very special bond between you and your baby.  Breastfeeding is convenient. Breast milk costs nothing and is always available at the correct temperature.  Breastfeeding helps to burn calories. It helps you to lose the weight that you gained during pregnancy.  Breastfeeding makes your uterus return faster to its size before pregnancy. It also slows bleeding (lochia) after you give birth.  Breastfeeding helps to lower your risk of developing type 2 diabetes, osteoporosis, rheumatoid arthritis, cardiovascular disease, and breast, ovarian, uterine, and endometrial cancer later in life. Breastfeeding basics Starting breastfeeding  Find a comfortable place to sit or lie down, with your neck and back well-supported.  Place a pillow or a rolled-up blanket under your baby to bring him or her to the level of your breast (if you are seated). Nursing pillows are specially designed to help support your arms and your baby while you breastfeed.  Make sure that your baby's tummy (abdomen) is facing your abdomen.  Gently massage your breast. With your fingertips, massage from the outer edges of your breast inward toward the nipple. This encourages milk flow. If your milk flows slowly, you may need to continue this action during  the feeding.  Support your breast with 4 fingers underneath and your thumb above your nipple (make the letter "C" with your hand). Make sure your fingers are well away from your nipple and your babys mouth.  Stroke your baby's lips gently with your finger or nipple.  When your baby's mouth is open wide enough, quickly bring your baby to your breast, placing your entire nipple and as much of the areola as possible into your baby's mouth. The areola is the colored area around your nipple. ? More areola should be visible above your baby's upper lip than below the lower lip. ? Your baby's lips should be opened and extended outward (flanged) to  ensure an adequate, comfortable latch. ? Your baby's tongue should be between his or her lower gum and your breast.  Make sure that your baby's mouth is correctly positioned around your nipple (latched). Your baby's lips should create a seal on your breast and be turned out (everted).  It is common for your baby to suck about 2-3 minutes in order to start the flow of breast milk. Latching Teaching your baby how to latch onto your breast properly is very important. An improper latch can cause nipple pain, decreased milk supply, and poor weight gain in your baby. Also, if your baby is not latched onto your nipple properly, he or she may swallow some air during feeding. This can make your baby fussy. Burping your baby when you switch breasts during the feeding can help to get rid of the air. However, teaching your baby to latch on properly is still the best way to prevent fussiness from swallowing air while breastfeeding. Signs that your baby has successfully latched onto your nipple  Silent tugging or silent sucking, without causing you pain. Infant's lips should be extended outward (flanged).  Swallowing heard between every 3-4 sucks once your milk has started to flow (after your let-down milk reflex occurs).  Muscle movement above and in front of his or her ears while sucking. Signs that your baby has not successfully latched onto your nipple  Sucking sounds or smacking sounds from your baby while breastfeeding.  Nipple pain. If you think your baby has not latched on correctly, slip your finger into the corner of your babys mouth to break the suction and place it between your baby's gums. Attempt to start breastfeeding again. Signs of successful breastfeeding Signs from your baby  Your baby will gradually decrease the number of sucks or will completely stop sucking.  Your baby will fall asleep.  Your baby's body will relax.  Your baby will retain a small amount of milk in his or her  mouth.  Your baby will let go of your breast by himself or herself. Signs from you  Breasts that have increased in firmness, weight, and size 1-3 hours after feeding.  Breasts that are softer immediately after breastfeeding.  Increased milk volume, as well as a change in milk consistency and color by the fifth day of breastfeeding.  Nipples that are not sore, cracked, or bleeding. Signs that your baby is getting enough milk  Wetting at least 1-2 diapers during the first 24 hours after birth.  Wetting at least 5-6 diapers every 24 hours for the first week after birth. The urine should be clear or pale yellow by the age of 5 days.  Wetting 6-8 diapers every 24 hours as your baby continues to grow and develop.  At least 3 stools in a 24-hour period by the  age of 5 days. The stool should be soft and yellow.  At least 3 stools in a 24-hour period by the age of 7 days. The stool should be seedy and yellow.  No loss of weight greater than 10% of birth weight during the first 3 days of life.  Average weight gain of 4-7 oz (113-198 g) per week after the age of 4 days.  Consistent daily weight gain by the age of 5 days, without weight loss after the age of 2 weeks. After a feeding, your baby may spit up a small amount of milk. This is normal. Breastfeeding frequency and duration Frequent feeding will help you make more milk and can prevent sore nipples and extremely full breasts (breast engorgement). Breastfeed when you feel the need to reduce the fullness of your breasts or when your baby shows signs of hunger. This is called "breastfeeding on demand." Signs that your baby is hungry include:  Increased alertness, activity, or restlessness.  Movement of the head from side to side.  Opening of the mouth when the corner of the mouth or cheek is stroked (rooting).  Increased sucking sounds, smacking lips, cooing, sighing, or squeaking.  Hand-to-mouth movements and sucking on fingers or  hands.  Fussing or crying. Avoid introducing a pacifier to your baby in the first 4-6 weeks after your baby is born. After this time, you may choose to use a pacifier. Research has shown that pacifier use during the first year of a baby's life decreases the risk of sudden infant death syndrome (SIDS). Allow your baby to feed on each breast as long as he or she wants. When your baby unlatches or falls asleep while feeding from the first breast, offer the second breast. Because newborns are often sleepy in the first few weeks of life, you may need to awaken your baby to get him or her to feed. Breastfeeding times will vary from baby to baby. However, the following rules can serve as a guide to help you make sure that your baby is properly fed:  Newborns (babies 53 weeks of age or younger) may breastfeed every 1-3 hours.  Newborns should not go without breastfeeding for longer than 3 hours during the day or 5 hours during the night.  You should breastfeed your baby a minimum of 8 times in a 24-hour period. Breast milk pumping     Pumping and storing breast milk allows you to make sure that your baby is exclusively fed your breast milk, even at times when you are unable to breastfeed. This is especially important if you go back to work while you are still breastfeeding, or if you are not able to be present during feedings. Your lactation consultant can help you find a method of pumping that works best for you and give you guidelines about how long it is safe to store breast milk. Caring for your breasts while you breastfeed Nipples can become dry, cracked, and sore while breastfeeding. The following recommendations can help keep your breasts moisturized and healthy:  Avoid using soap on your nipples.  Wear a supportive bra designed especially for nursing. Avoid wearing underwire-style bras or extremely tight bras (sports bras).  Air-dry your nipples for 3-4 minutes after each feeding.  Use only  cotton bra pads to absorb leaked breast milk. Leaking of breast milk between feedings is normal.  Use lanolin on your nipples after breastfeeding. Lanolin helps to maintain your skin's normal moisture barrier. Pure lanolin is not harmful (not toxic)  to your baby. You may also hand express a few drops of breast milk and gently massage that milk into your nipples and allow the milk to air-dry. In the first few weeks after giving birth, some women experience breast engorgement. Engorgement can make your breasts feel heavy, warm, and tender to the touch. Engorgement peaks within 3-5 days after you give birth. The following recommendations can help to ease engorgement:  Completely empty your breasts while breastfeeding or pumping. You may want to start by applying warm, moist heat (in the shower or with warm, water-soaked hand towels) just before feeding or pumping. This increases circulation and helps the milk flow. If your baby does not completely empty your breasts while breastfeeding, pump any extra milk after he or she is finished.  Apply ice packs to your breasts immediately after breastfeeding or pumping, unless this is too uncomfortable for you. To do this: ? Put ice in a plastic bag. ? Place a towel between your skin and the bag. ? Leave the ice on for 20 minutes, 2-3 times a day.  Make sure that your baby is latched on and positioned properly while breastfeeding. If engorgement persists after 48 hours of following these recommendations, contact your health care provider or a Advertising copywriter. Overall health care recommendations while breastfeeding  Eat 3 healthy meals and 3 snacks every day. Well-nourished mothers who are breastfeeding need an additional 450-500 calories a day. You can meet this requirement by increasing the amount of a balanced diet that you eat.  Drink enough water to keep your urine pale yellow or clear.  Rest often, relax, and continue to take your prenatal vitamins  to prevent fatigue, stress, and low vitamin and mineral levels in your body (nutrient deficiencies).  Do not use any products that contain nicotine or tobacco, such as cigarettes and e-cigarettes. Your baby may be harmed by chemicals from cigarettes that pass into breast milk and exposure to secondhand smoke. If you need help quitting, ask your health care provider.  Avoid alcohol.  Do not use illegal drugs or marijuana.  Talk with your health care provider before taking any medicines. These include over-the-counter and prescription medicines as well as vitamins and herbal supplements. Some medicines that may be harmful to your baby can pass through breast milk.  It is possible to become pregnant while breastfeeding. If birth control is desired, ask your health care provider about options that will be safe while breastfeeding your baby. Where to find more information: [employer]: www.llli.org Contact a health care provider if:  You feel like you want to stop breastfeeding or have become frustrated with breastfeeding.  Your nipples are cracked or bleeding.  Your breasts are red, tender, or warm.  You have: ? Painful breasts or nipples. ? A swollen area on either breast. ? A fever or chills. ? Nausea or vomiting. ? Drainage other than breast milk from your nipples.  Your breasts do not become full before feedings by the fifth day after you give birth.  You feel sad and depressed.  Your baby is: ? Too sleepy to eat well. ? Having trouble sleeping. ? More than 84 week old and wetting fewer than 6 diapers in a 24-hour period. ? Not gaining weight by 35 days of age.  Your baby has fewer than 3 stools in a 24-hour period.  Your baby's skin or the white parts of his or her eyes become yellow. Get help right away if:  Your baby is  overly tired (lethargic) and does not want to wake up and feed.  Your baby develops an unexplained fever. Summary  Breastfeeding  offers many health benefits for infant and mothers.  Try to breastfeed your infant when he or she shows early signs of hunger.  Gently tickle or stroke your baby's lips with your finger or nipple to allow the baby to open his or her mouth. Bring the baby to your breast. Make sure that much of the areola is in your baby's mouth. Offer one side and burp the baby before you offer the other side.  Talk with your health care provider or lactation consultant if you have questions or you face problems as you breastfeed. This information is not intended to replace advice given to you by your health care provider. Make sure you discuss any questions you have with your health care provider. Document Released: 08/17/2005 Document Revised: 11/11/2017 Document Reviewed: 09/18/2016 Elsevier Patient Education  2020 [employer] Cesarean Delivery, Care After This sheet gives you information about how to care for yourself after your procedure. Your health care provider may also give you more specific instructions. If you have problems or questions, contact your health care provider. What can I expect after the procedure? After the procedure, it is common to have:  A small amount of blood or clear fluid coming from the incision.  Some redness, swelling, and pain in your incision area.  Some abdominal pain and soreness.  Vaginal bleeding (lochia). Even though you did not have a vaginal delivery, you will still have vaginal bleeding and discharge.  Pelvic cramps.  Fatigue. You may have pain, swelling, and discomfort in the tissue between your vagina and your anus (perineum) if:  Your C-section was unplanned, and you were allowed to labor and push.  An incision was made in the area (episiotomy) or the tissue tore during attempted vaginal delivery. Follow these instructions at home: Incision care   Follow instructions from your health care provider about how to take care of your incision. Make sure  you: ? Wash your hands with soap and water before you change your bandage (dressing). If soap and water are not available, use hand sanitizer. ? If you have a dressing, change it or remove it as told by your health care provider. ? Leave stitches (sutures), skin staples, skin glue, or adhesive strips in place. These skin closures may need to stay in place for 2 weeks or longer. If adhesive strip edges start to loosen and curl up, you may trim the loose edges. Do not remove adhesive strips completely unless your health care provider tells you to do that.  Check your incision area every day for signs of infection. Check for: ? More redness, swelling, or pain. ? More fluid or blood. ? Warmth. ? Pus or a bad smell.  Do not take baths, swim, or use a hot tub until your health care provider says it's okay. Ask your health care provider if you can take showers.  When you cough or sneeze, hug a pillow. This helps with pain and decreases the chance of your incision opening up (dehiscing). Do this until your incision heals. Medicines  Take over-the-counter and prescription medicines only as told by your health care provider.  If you were prescribed an antibiotic medicine, take it as told by your health care provider. Do not stop taking the antibiotic even if you start to feel better.  Do not drive or use heavy machinery while taking prescription pain  medicine. Lifestyle  Do not drink alcohol. This is especially important if you are breastfeeding or taking pain medicine.  Do not use any products that contain nicotine or tobacco, such as cigarettes, e-cigarettes, and chewing tobacco. If you need help quitting, ask your health care provider. Eating and drinking  Drink at least 8 eight-ounce glasses of water every day unless told not to by your health care provider. If you breastfeed, you may need to drink even more water.  Eat high-fiber foods every day. These foods may help prevent or relieve  constipation. High-fiber foods include: ? Whole grain cereals and breads. ? Hickle rice. ? Beans. ? Fresh fruits and vegetables. Activity   If possible, have someone help you care for your baby and help with household activities for at least a few days after you leave the hospital.  Return to your normal activities as told by your health care provider. Ask your health care provider what activities are safe for you.  Rest as much as possible. Try to rest or take a nap while your baby is sleeping.  Do not lift anything that is heavier than 10 lbs (4.5 kg), or the limit that you were told, until your health care provider says that it is safe.  Talk with your health care provider about when you can engage in sexual activity. This may depend on your: ? Risk of infection. ? How fast you heal. ? Comfort and desire to engage in sexual activity. General instructions  Do not use tampons or douches until your health care provider approves.  Wear loose, comfortable clothing and a supportive and well-fitting bra.  Keep your perineum clean and dry. Wipe from front to back when you use the toilet.  If you pass a blood clot, save it and call your health care provider to discuss. Do not flush blood clots down the toilet before you get instructions from your health care provider.  Keep all follow-up visits for you and your baby as told by your health care provider. This is important. Contact a health care provider if:  You have: ? A fever. ? Bad-smelling vaginal discharge. ? Pus or a bad smell coming from your incision. ? Difficulty or pain when urinating. ? A sudden increase or decrease in the frequency of your bowel movements. ? More redness, swelling, or pain around your incision. ? More fluid or blood coming from your incision. ? A rash. ? Nausea. ? Little or no interest in activities you used to enjoy. ? Questions about caring for yourself or your baby.  Your incision feels warm to  the touch.  Your breasts turn red or become painful or hard.  You feel unusually sad or worried.  You vomit.  You pass a blood clot from your vagina.  You urinate more than usual.  You are dizzy or light-headed. Get help right away if:  You have: ? Pain that does not go away or get better with medicine. ? Chest pain. ? Difficulty breathing. ? Blurred vision or spots in your vision. ? Thoughts about hurting yourself or your baby. ? New pain in your abdomen or in one of your legs. ? A severe headache.  You faint.  You bleed from your vagina so much that you fill more than one sanitary pad in one hour. Bleeding should not be heavier than your heaviest period. Summary  After the procedure, it is common to have pain at your incision site, abdominal cramping, and slight bleeding from your  vagina.  Check your incision area every day for signs of infection.  Tell your health care provider about any unusual symptoms.  Keep all follow-up visits for you and your baby as told by your health care provider. This information is not intended to replace advice given to you by your health care provider. Make sure you discuss any questions you have with your health care provider. Document Released: 05/09/2002 Document Revised: 02/23/2018 Document Reviewed: 02/23/2018 Elsevier Patient Education  2020 [employer].

## 2019-06-09 ENCOUNTER — Encounter: Payer: Self-pay | Admitting: Obstetrics and Gynecology

## 2019-06-09 LAB — CBC
HCT: 25.2 % — ABNORMAL LOW (ref 36.0–46.0)
Hemoglobin: 8.4 g/dL — ABNORMAL LOW (ref 12.0–15.0)
MCH: 25.1 pg — ABNORMAL LOW (ref 26.0–34.0)
MCHC: 33.3 g/dL (ref 30.0–36.0)
MCV: 75.2 fL — ABNORMAL LOW (ref 80.0–100.0)
Platelets: 180 10*3/uL (ref 150–400)
RBC: 3.35 MIL/uL — ABNORMAL LOW (ref 3.87–5.11)
RDW: 15.6 % — ABNORMAL HIGH (ref 11.5–15.5)
WBC: 13.6 10*3/uL — ABNORMAL HIGH (ref 4.0–10.5)
nRBC: 0 % (ref 0.0–0.2)

## 2019-06-09 MED ORDER — ACETAMINOPHEN 500 MG PO TABS
1000.0000 mg | ORAL_TABLET | Freq: Four times a day (QID) | ORAL | Status: DC
  Administered 2019-06-09 – 2019-06-10 (×2): 1000 mg via ORAL
  Filled 2019-06-09 (×2): qty 2

## 2019-06-09 NOTE — Anesthesia Post-op Follow-up Note (Signed)
  Anesthesia Pain Follow-up Note  Patient: Natasha Alvarez  Day #: 1  Date of Follow-up: 06/09/2019 Time: 12:22 PM  Last Vitals:  Vitals:   06/08/19 2030 06/09/19 0717  BP:  137/72  Pulse: 96   Resp:  18  Temp:  37 C  SpO2: 97% 99%    Level of Consciousness: alert  Pain: mild   Side Effects:None  Catheter Site Exam:clean, dry, no drainage     Plan: D/C from anesthesia care at surgeon's request  [place]

## 2019-06-09 NOTE — Anesthesia Postprocedure Evaluation (Signed)
Anesthesia Post Note  Patient: HENNESY SOBALVARRO  Procedure(s) Performed: CESAREAN SECTION (N/A Abdomen)  Patient location during evaluation: Mother Baby Anesthesia Type: Epidural Level of consciousness: awake and alert Pain management: pain level controlled Vital Signs Assessment: post-procedure vital signs reviewed and stable Respiratory status: spontaneous breathing, nonlabored ventilation and respiratory function stable Cardiovascular status: stable Postop Assessment: no headache, no backache, patient able to bend at knees and able to ambulate Anesthetic complications: no     Last Vitals:  Vitals:   06/08/19 2030 06/09/19 0717  BP:  137/72  Pulse: 96   Resp:  18  Temp:  37 C  SpO2: 97% 99%    Last Pain:  Vitals:   06/09/19 0815  TempSrc:   PainSc: 2                  Precious Haws Piscitello

## 2019-06-09 NOTE — Lactation Note (Signed)
This note was copied from a baby's chart. Lactation Consultation Note  Patient Name: Boy Tomica Arseneault IRCVE'L Date: 06/09/2019 Reason for consult: Follow-up assessment;[place]   Maternal Data Formula Feeding for Exclusion: No Does the patient have breastfeeding experience prior to this delivery?: No  Feeding Feeding Type: Breast Fed  LATCH Score Latch: Grasps breast easily, tongue down, lips flanged, rhythmical sucking.  Audible Swallowing: Spontaneous and intermittent  Type of Nipple: Everted at rest and after stimulation  Comfort (Breast/Nipple): Soft / non-tender  Hold (Positioning): No assistance needed to correctly position infant at breast.  LATCH Score: 10  Interventions    Lactation Tools Discussed/Used     Consult Status Consult Status: PRN Date: 06/09/19 Follow-up type: In-patient    Ferol Luz 06/09/2019, 1:24 PM

## 2019-06-09 NOTE — Progress Notes (Addendum)
Obstetric Postpartum/PostOperative Daily Progress Note Subjective:  28 y.o. G1P1001 post-operative day # 1 status post primary cesarean delivery.  She is ambulating, is tolerating po, is voiding spontaneously.  Her pain is well controlled on PO pain medications. Her lochia is less than menses. She reports breastfeeding is going well.    Medications SCHEDULED MEDICATIONS  . acetaminophen  1,000 mg Oral Q6H  . ibuprofen  800 mg Oral Q8H  . metoCLOPramide  5 mg Oral TID AC & HS  . prenatal multivitamin  1 tablet Oral Q1200  . senna-docusate  2 tablet Oral Q24H  . simethicone  80 mg Oral TID PC  . simethicone  80 mg Oral Q24H    MEDICATION INFUSIONS  . bupivacaine 0.25 % ON-Q pump DUAL CATH 400 mL    . bupivacaine ON-Q pain pump    . lactated ringers    . naLOXone [facility]) adult infusion for PRURITIS      PRN MEDICATIONS  bisacodyl, coconut oil, witch hazel-glycerin **AND** dibucaine, diphenhydrAMINE, menthol-cetylpyridinium, morphine injection, naloxone **AND** sodium chloride flush, naLOXone (NARCAN) adult infusion for PRURITIS, ondansetron (ZOFRAN) IV, oxyCODONE, simethicone, sodium phosphate, zolpidem    Objective:   Vitals:   06/08/19 1740 06/08/19 2015 06/08/19 2030 06/09/19 0717  BP:  126/63  137/72  Pulse: (!) 139 100 96   Resp:  18  18  Temp:  99 F (37.2 C)  98.6 F (37 C)  TempSrc:  Oral  Oral  SpO2: 97% 100% 97% 99%  Weight:      Height:        Current Vital Signs 24h Vital Sign Ranges  T 98.6 F (37 C) Temp  Avg: 98.9 F (37.2 C)  Min: 98.5 F (36.9 C)  Max: 99.3 F (37.4 C)  BP 137/72 BP  Min: 103/72  Max: 137/72  HR 96 Pulse  Avg: 107.5  Min: 95  Max: 139  RR 18 Resp  Avg: 18.7  Min: 18  Max: 20  SaO2 99 % (room air) SpO2  Avg: 98 %  Min: 92 %  Max: 100 %       24 Hour I/O Current Shift I/O  Time Ins Outs 10/08 0701 - 10/09 0700 In: -  Out: 2350 [Urine:2275] No intake/output data recorded.  General: NAD Pulmonary: no increased work of  breathing Abdomen: non-distended, non-tender, fundus firm at level of umbilicus Inc: Clean/dry/intact Extremities: no edema, no erythema, no tenderness  Labs:  Recent Labs  Lab 06/07/19 0850 06/08/19 1347 06/09/19 0625  WBC 8.3 13.1* 13.6*  HGB 10.5* 8.3* 8.4*  HCT 31.1* 25.1* 25.2*  PLT 202 144* 180     Assessment:   28 y.o. G1P1001 postoperative day # 1 status post primary low transverse with T extension cesarean section (MD Schuman recommends c/s for future deliveries)  Plan:  1) Acute blood loss anemia - hemodynamically stable and asymptomatic - po ferrous sulfate  2) A POS / Rubella 4.26 (04/01 1210)/ Varicella Immune  3) TDAP status given antepartum  4) Breastfeeding/Contraception = plans Nexplanon  5) Disposition: continue current care   Rod Can, CNM 06/09/2019 9:33 AM

## 2019-06-10 DIAGNOSIS — Z98891 History of uterine scar from previous surgery: Secondary | ICD-10-CM

## 2019-06-10 MED ORDER — OXYCODONE-ACETAMINOPHEN 5-325 MG PO TABS: 1.0000 | ORAL_TABLET | ORAL | 0 refills | Status: DC | PRN

## 2019-06-10 NOTE — Progress Notes (Signed)
Admit Date: 06/07/2019 Today's Date: 06/10/2019  Subjective: Postpartum Day 2: Cesarean Delivery Patient reports tolerating PO, + flatus and no problems voiding.    Objective: Vital signs in last 24 hours: Temp:  [98.3 F (36.8 C)-98.7 F (37.1 C)] 98.7 F (37.1 C) (10/10 0810) Pulse Rate:  [102-112] 102 (10/10 0810) Resp:  [18-20] 18 (10/10 0810) BP: (120-147)/(71-82) 136/72 (10/10 0810) SpO2:  [95 %-100 %] 95 % (10/10 0810)  Physical Exam:  General: alert and cooperative Lochia: appropriate Uterine Fundus: firm Incision: healing well, no significant drainage DVT Evaluation: No evidence of DVT seen on physical exam. Negative Homan's sign.  Recent Labs    06/08/19 1347 06/09/19 0625  HGB 8.3* 8.4*  HCT 25.1* 25.2*    Assessment/Plan: Status post Cesarean section. Postoperative course complicated by none  Discharge home with standard precautions and return to clinic in 1 weeks. Breast and bottle feeding Percocet and Motrin for pain Fe encouraged for mild anemia  Hoyt Koch 06/10/2019, 9:51 AM

## 2019-06-10 NOTE — Plan of Care (Signed)
D/C order from Dr. Kenton Kingfisher.  Reviewed d/c instructions and prescriptions with patient and answered any questions. Patient and infant d/c home via wheelchair by nursing staff.

## 2019-06-12 ENCOUNTER — Telehealth: Payer: Self-pay | Admitting: Obstetrics and Gynecology

## 2019-06-12 NOTE — Telephone Encounter (Signed)
-----   Message from Gae Dry, MD sent at 06/10/2019  9:49 AM EDT ----- Regarding: Appt CS Sch Post Op appt w CS this week Let pt know

## 2019-06-12 NOTE — Telephone Encounter (Signed)
Called and left voice mail for patient to call back to be schedule °

## 2019-06-12 NOTE — Telephone Encounter (Signed)
Patient is schedule for 06/15/19 at 9

## 2019-06-13 ENCOUNTER — Telehealth: Payer: Self-pay

## 2019-06-13 NOTE — Telephone Encounter (Signed)
Pt called after hour nurse 06/11/19 12:52pm stating she had an emergency C/S 10/8 and had an ON-Q pump attached to her stomach for medication.  Is supposed to be removed on Tues 10/13; now leaking, loose and the tape has peeled off; medicine bulb is empty.  Denies fevers.  Pian is 2/10, soreness.  Incision c no spreading redness or drainage.  After hour nurse paged [place] who adv for pt to remove ON-Q punp catheter; may have some leakage for a day or so, can place bandaid over site.

## 2019-06-15 ENCOUNTER — Encounter: Payer: Self-pay | Admitting: Obstetrics and Gynecology

## 2019-06-15 ENCOUNTER — Other Ambulatory Visit: Payer: Self-pay

## 2019-06-15 ENCOUNTER — Ambulatory Visit (INDEPENDENT_AMBULATORY_CARE_PROVIDER_SITE_OTHER): Payer: BC Managed Care – PPO | Admitting: Obstetrics and Gynecology

## 2019-06-15 VITALS — BP 140/80 | HR 76 | Ht 69.0 in | Wt 287.0 lb

## 2019-06-15 DIAGNOSIS — R030 Elevated blood-pressure reading, without diagnosis of hypertension: Secondary | ICD-10-CM

## 2019-06-15 DIAGNOSIS — O1495 Unspecified pre-eclampsia, complicating the puerperium: Secondary | ICD-10-CM

## 2019-06-15 LAB — CBC
Hematocrit: 26.3 % — ABNORMAL LOW (ref 34.0–46.6)
Hemoglobin: 8.6 g/dL — ABNORMAL LOW (ref 11.1–15.9)
MCH: 24.4 pg — ABNORMAL LOW (ref 26.6–33.0)
MCHC: 32.7 g/dL (ref 31.5–35.7)
MCV: 75 fL — ABNORMAL LOW (ref 79–97)
Platelets: 280 10*3/uL (ref 150–450)
RBC: 3.52 x10E6/uL — ABNORMAL LOW (ref 3.77–5.28)
RDW: 16.4 % — ABNORMAL HIGH (ref 11.7–15.4)
WBC: 6.2 10*3/uL (ref 3.4–10.8)

## 2019-06-15 LAB — COMPREHENSIVE METABOLIC PANEL
ALT: 33 IU/L — ABNORMAL HIGH (ref 0–32)
AST: 20 IU/L (ref 0–40)
Albumin/Globulin Ratio: 1.1 — ABNORMAL LOW (ref 1.2–2.2)
Albumin: 3.1 g/dL — ABNORMAL LOW (ref 3.9–5.0)
Alkaline Phosphatase: 160 IU/L — ABNORMAL HIGH (ref 39–117)
BUN/Creatinine Ratio: 22 (ref 9–23)
BUN: 11 mg/dL (ref 6–20)
Bilirubin Total: 0.3 mg/dL (ref 0.0–1.2)
CO2: 21 mmol/L (ref 20–29)
Calcium: 8.1 mg/dL — ABNORMAL LOW (ref 8.7–10.2)
Chloride: 107 mmol/L — ABNORMAL HIGH (ref 96–106)
Creatinine, Ser: 0.5 mg/dL — ABNORMAL LOW (ref 0.57–1.00)
GFR calc Af Amer: 154 mL/min/{1.73_m2} (ref >59)
GFR calc non Af Amer: 134 mL/min/{1.73_m2} (ref >59)
Globulin, Total: 2.8 g/dL (ref 1.5–4.5)
Glucose: 86 mg/dL (ref 65–99)
Potassium: 3.6 mmol/L (ref 3.5–5.2)
Sodium: 142 mmol/L (ref 134–144)
Total Protein: 5.9 g/dL — ABNORMAL LOW (ref 6.0–8.5)

## 2019-06-15 MED ORDER — AMLODIPINE BESYLATE 5 MG PO TABS: 5.0000 mg | ORAL_TABLET | Freq: Every day | ORAL | 2 refills | Status: DC

## 2019-06-15 NOTE — Patient Instructions (Signed)
Check your BP at home twice a day.  Come to the hospital if your reading is 160 / 110  or greater Come to the hospital ER if you have vision changes, right sided abdominal pain or headache.    Preeclampsia and Eclampsia Preeclampsia is a serious condition that may develop during pregnancy. This condition causes high blood pressure and increased protein in your urine along with other symptoms, such as headaches and vision changes. These symptoms may develop as the condition gets worse. Preeclampsia may occur at 20 weeks of pregnancy or later. Diagnosing and treating preeclampsia early is very important. If not treated early, it can cause serious problems for you and your baby. One problem it can lead to is eclampsia. Eclampsia is a condition that causes muscle jerking or shaking (convulsions or seizures) and other serious problems for the mother. During pregnancy, delivering your baby may be the best treatment for preeclampsia or eclampsia. For most women, preeclampsia and eclampsia symptoms go away after giving birth. In rare cases, a woman may develop preeclampsia after giving birth (postpartum preeclampsia). This usually occurs within 48 hours after childbirth but may occur up to 6 weeks after giving birth. What are the causes? The cause of preeclampsia is not known. What increases the risk? The following risk factors make you more likely to develop preeclampsia:  Being pregnant for the first time.  Having had preeclampsia during a past pregnancy.  Having a family history of preeclampsia.  Having high blood pressure.  Being pregnant with more than one baby.  Being 74 or older.  Being African-American.  Having kidney disease or diabetes.  Having medical conditions such as lupus or blood diseases.  Being very overweight (obese). What are the signs or symptoms? The most common symptoms are:  Severe headaches.  Vision problems, such as blurred or double vision.  Abdominal pain,  especially upper abdominal pain. Other symptoms that may develop as the condition gets worse include:  Sudden weight gain.  Sudden swelling of the hands, face, legs, and feet.  Severe nausea and vomiting.  Numbness in the face, arms, legs, and feet.  Dizziness.  Urinating less than usual.  Slurred speech.  Convulsions or seizures. How is this diagnosed? There are no screening tests for preeclampsia. Your health care provider will ask you about symptoms and check for signs of preeclampsia during your prenatal visits. You may also have tests that include:  Checking your blood pressure.  Urine tests to check for protein. Your health care provider will check for this at every prenatal visit.  Blood tests.  Monitoring your baby's heart rate.  Ultrasound. How is this treated? You and your health care provider will determine the treatment approach that is best for you. Treatment may include:  Having more frequent prenatal exams to check for signs of preeclampsia, if you have an increased risk for preeclampsia.  Medicine to lower your blood pressure.  Staying in the hospital, if your condition is severe. There, treatment will focus on controlling your blood pressure and the amount of fluids in your body (fluid retention).  Taking medicine (magnesium sulfate) to prevent seizures. This may be given as an injection or through an IV.  Taking a low-dose aspirin during your pregnancy.  Delivering your baby early. You may have your labor started with medicine (induced), or you may have a cesarean delivery. Follow these instructions at home: Eating and drinking   Drink enough fluid to keep your urine pale yellow.  Avoid caffeine. Lifestyle  Do not use any products that contain nicotine or tobacco, such as cigarettes and e-cigarettes. If you need help quitting, ask your health care provider.  Do not use alcohol or drugs.  Avoid stress as much as possible. Rest and get plenty  of sleep. General instructions  Take over-the-counter and prescription medicines only as told by your health care provider.  When lying down, lie on your left side. This keeps pressure off your major blood vessels.  When sitting or lying down, raise (elevate) your feet. Try putting some pillows underneath your lower legs.  Exercise regularly. Ask your health care provider what kinds of exercise are best for you.  Keep all follow-up and prenatal visits as told by your health care provider. This is important. How is this prevented? There is no known way of preventing preeclampsia or eclampsia from developing. However, to lower your risk of complications and detect problems early:  Get regular prenatal care. Your health care provider may be able to diagnose and treat the condition early.  Maintain a healthy weight. Ask your health care provider for help managing weight gain during pregnancy.  Work with your health care provider to manage any long-term (chronic) health conditions you have, such as diabetes or kidney problems.  You may have tests of your blood pressure and kidney function after giving birth.  Your health care provider may have you take low-dose aspirin during your next pregnancy. Contact a health care provider if:  You have symptoms that your health care provider told you may require more treatment or monitoring, such as: ? Headaches. ? Nausea or vomiting. ? Abdominal pain. ? Dizziness. ? Light-headedness. Get help right away if:  You have severe: ? Abdominal pain. ? Headaches that do not get better. ? Dizziness. ? Vision problems. ? Confusion. ? Nausea or vomiting.  You have any of the following: ? A seizure. ? Sudden, rapid weight gain. ? Sudden swelling in your hands, ankles, or face. ? Trouble moving any part of your body. ? Numbness in any part of your body. ? Trouble speaking. ? Abnormal bleeding.  You faint. Summary  Preeclampsia is a serious  condition that may develop during pregnancy.  This condition causes high blood pressure and increased protein in your urine along with other symptoms, such as headaches and vision changes.  Diagnosing and treating preeclampsia early is very important. If not treated early, it can cause serious problems for you and your baby.  Get help right away if you have symptoms that your health care provider told you to watch for. This information is not intended to replace advice given to you by your health care provider. Make sure you discuss any questions you have with your health care provider. Document Released: 08/14/2000 Document Revised: 04/19/2018 Document Reviewed: 03/23/2016 Elsevier Patient Education  2020 [employer].

## 2019-06-15 NOTE — Progress Notes (Signed)
  OBSTETRICS POSTPARTUM CLINIC PROGRESS NOTE  Subjective:     Natasha Alvarez is a 28 y.o. G83P1001 female who presents for a postpartum visit. She is 1 week postpartum following a Term pregnancy and delivery by C-section failure to progress.  I have fully reviewed the prenatal and intrapartum course. Anesthesia: epidural.  Postpartum course has been complicated by complicated by lower extremity swelling bilaterally.  Baby is feeding by Bottle and Breast.  Bleeding: patient has not  resumed menses.  Bowel function is normal. Bladder function is normal.  Patient is not sexually active. Contraception method desired is Nexplanon.  Postpartum depression screening: negative. The following portions of the patient's history were reviewed and updated as appropriate: allergies, current medications, past family history, past medical history, past social history, past surgical history and problem list.  Review of Systems Pertinent items are noted in HPI.  Objective:    BP 140/80   Pulse 76   Ht 5\' 9"  (1.753 m)   Wt 287 lb (130.2 kg)   LMP 08/14/2018 (Exact Date)   Breastfeeding Yes   BMI 42.38 kg/m   General:  alert and no distress   Breasts:  inspection negative, no nipple discharge or bleeding, no masses or nodularity palpable  Lungs: clear to auscultation bilaterally  Heart:  regular rate and rhythm, S1, S2 normal, no murmur, click, rub or gallop  Abdomen: soft, non-tender; bowel sounds normal; no masses,  no organomegaly.   Well healed Pfannenstiel incision   Vulva:  normal  Vagina: normal vagina, no discharge, exudate, lesion, or erythema  Cervix:  no cervical motion tenderness and no lesions  Corpus: normal size, contour, position, consistency, mobility, non-tender  Adnexa:  normal adnexa and no mass, fullness, tenderness  Rectal Exam: Not performed.          Assessment:  Post Partum Care visit 1. Elevated blood pressure reading   - Protein / creatinine ratio, urine - CBC -  Comprehensive metabolic panel - amLODipine (NORVASC) 5 MG tablet; Take 1 tablet (5 mg total) by mouth daily.  Dispense: 30 tablet; Refill: 2  2. Preeclampsia in postpartum period   - Protein / creatinine ratio, urine - CBC - Comprehensive metabolic panel - amLODipine (NORVASC) 5 MG tablet; Take 1 tablet (5 mg total) by mouth daily.  Dispense: 30 tablet; Refill: 2   Plan:  See orders and Patient Instructions  Bilateral swollen legs, 1+ protein in office, and elevated blood pressure consistent with postpartum preeclampsia, moderate range blood pressures. Will start on BP medication.  Given preeclampsia and eclampsia warnings. Follow up early next week.  Incision healing well, no problems. Dicussed care and infection precautions.   Adrian Prows MD [facility] 06/15/2019 10:03 AM

## 2019-06-16 ENCOUNTER — Telehealth: Payer: Self-pay

## 2019-06-16 LAB — PROTEIN / CREATININE RATIO, URINE
Creatinine, Urine: 121.3 mg/dL
Protein, Ur: 39.8 mg/dL
Protein/Creat Ratio: 328 mg/g creat — ABNORMAL HIGH (ref 0–200)

## 2019-06-16 NOTE — Telephone Encounter (Signed)
Pt called after hour nurse 06/15/19 5:12pm c/o c/s last Thursday; c/s gauze is now bleeding in one spot.  States it was just a spot of blood.  After hour nurse adv her to be seen within 24hrs.  P/c to pt this morning; she doesn't think she needs to be seen today; has appt Mon.  Adv to keep incision clean/dry.  If having to change bandage quit often to be seen.

## 2019-06-17 ENCOUNTER — Encounter: Payer: Self-pay | Admitting: Obstetrics and Gynecology

## 2019-06-19 ENCOUNTER — Other Ambulatory Visit: Payer: Self-pay

## 2019-06-19 ENCOUNTER — Encounter: Payer: Self-pay | Admitting: Obstetrics and Gynecology

## 2019-06-19 ENCOUNTER — Ambulatory Visit (INDEPENDENT_AMBULATORY_CARE_PROVIDER_SITE_OTHER): Payer: BC Managed Care – PPO | Admitting: Obstetrics and Gynecology

## 2019-06-19 VITALS — BP 128/86 | Wt 279.0 lb

## 2019-06-19 DIAGNOSIS — M7989 Other specified soft tissue disorders: Secondary | ICD-10-CM

## 2019-06-19 MED ORDER — MEDICAL COMPRESSION SOCKS MISC: 1.0000 | Freq: Every day | 0 refills | Status: DC

## 2019-06-19 NOTE — Patient Instructions (Signed)

## 2019-06-19 NOTE — Progress Notes (Signed)
  OBSTETRICS POSTPARTUM CLINIC PROGRESS NOTE  Subjective:     Natasha Alvarez is a 28 y.o. G57P1001 female who presents for a postpartum visit. She is 2 weeks postpartum following a Term pregnancy and delivery by C-section failure to progress.  I have fully reviewed the prenatal and intrapartum course. Anesthesia: epidural.  Postpartum course has been complicated by complicated by pre-eclampsia.  Baby is feeding by Bottle and Breast.  Bleeding: patient has not  resumed menses.  Bowel function is normal. Bladder function is normal.  Patient is not sexually active.  She denies headache, vision changes and RUQ pain.  Her feet are significantly swollen still with little improvement. Left measures > right by 1 inch  The following portions of the patient's history were reviewed and updated as appropriate: allergies, current medications, past family history, past social history, past surgical history and problem list.  Review of Systems Pertinent items are noted in HPI.  Objective:    BP 128/86   Wt 279 lb (126.6 kg)   LMP 08/14/2018 (Exact Date)   BMI 41.20 kg/m   General:  alert and no distress   Breasts:  inspection negative, no nipple discharge or bleeding, no masses or nodularity palpable  Lungs: clear to auscultation bilaterally  Heart:  regular rate and rhythm, S1, S2 normal, no murmur, click, rub or gallop  Abdomen: soft, non-tender; bowel sounds normal; no masses,  no organomegaly.   Well healed Pfannenstiel incision   Vulva:  normal  Vagina: normal vagina, no discharge, exudate, lesion, or erythema  Cervix:  no cervical motion tenderness and no lesions  Corpus: normal size, contour, position, consistency, mobility, non-tender  Adnexa:  normal adnexa and no mass, fullness, tenderness  Rectal Exam: Not performed.          Assessment:  Post Partum Care visit There are no diagnoses linked to this encounter.  Plan:  See orders and Patient Instructions  Continue Norco RX  for compression socks sent Will obtain US to rule out DVT  Follow up in: 1 week or as needed.   Adrian Prows MD [facility] 06/19/2019 11:18 AM

## 2019-06-22 ENCOUNTER — Telehealth: Payer: Self-pay | Admitting: Obstetrics and Gynecology

## 2019-06-22 NOTE — Telephone Encounter (Signed)
-----   Message from [place] Lodge sent at 06/22/2019  8:33 AM EDT ----- Regarding: Ultrasound of legs Patient can go on Friday. Thanks. ----- Message ----- From: Homero Fellers, MD Sent: 06/21/2019   7:16 PM EDT To: Alexandria Lodge  I had ordered this patient an [place] of her legs- could you follow up with her about scheduling it. She has difficulty with transportation. Does not drive- said she could go Friday.  Thank you,  Dr. Gilman Schmidt

## 2019-06-22 NOTE — Telephone Encounter (Signed)
Patient is aware of location date and time 

## 2019-06-22 NOTE — Telephone Encounter (Signed)
Called and left detailed message for patient to call back to confirm. Patient is schedule at out patient imaging at [address] [place], [place] [address] on this Friday, 06/23/19 at 1:45. Patient is to arrive at 1:30 for registration.

## 2019-06-23 ENCOUNTER — Other Ambulatory Visit: Payer: Self-pay

## 2019-06-23 ENCOUNTER — Ambulatory Visit
Admission: RE | Admit: 2019-06-23 | Discharge: 2019-06-23 | Disposition: A | Discharge status: Home / Self Care | Payer: BC Managed Care – PPO | Source: Ambulatory Visit | Attending: Obstetrics and Gynecology | Admitting: Obstetrics and Gynecology

## 2019-06-23 DIAGNOSIS — M7989 Other specified soft tissue disorders: Secondary | ICD-10-CM | POA: Diagnosis not present

## 2019-06-23 DIAGNOSIS — R600 Localized edema: Secondary | ICD-10-CM | POA: Diagnosis not present

## 2019-06-26 ENCOUNTER — Other Ambulatory Visit: Payer: Self-pay

## 2019-06-26 ENCOUNTER — Ambulatory Visit (INDEPENDENT_AMBULATORY_CARE_PROVIDER_SITE_OTHER): Payer: BC Managed Care – PPO | Admitting: Obstetrics and Gynecology

## 2019-06-26 ENCOUNTER — Encounter: Payer: Self-pay | Admitting: Obstetrics and Gynecology

## 2019-06-26 DIAGNOSIS — O1495 Unspecified pre-eclampsia, complicating the puerperium: Secondary | ICD-10-CM | POA: Diagnosis not present

## 2019-06-26 DIAGNOSIS — M7989 Other specified soft tissue disorders: Secondary | ICD-10-CM

## 2019-06-26 LAB — POCT URINALYSIS DIPSTICK OB
Glucose, UA: NEGATIVE
POC,PROTEIN,UA: NEGATIVE

## 2019-06-26 NOTE — Progress Notes (Signed)
  OBSTETRICS POSTPARTUM CLINIC PROGRESS NOTE  Subjective:     Natasha Alvarez is a 28 y.o. G38P1001 female who presents for a postpartum visit. She is 2 weeks postpartum following a Term pregnancy and delivery by C-section failure to progress.  I have fully reviewed the prenatal and intrapartum course. Anesthesia: epidural.  Postpartum course has been complicated by complicated by pre-eclampsia.  Baby is feeding by Bottle and Breast.  Bleeding: patient has not  resumed menses.  Bowel function is normal. Bladder function is normal.  Patient is not sexually active. Contraception method desired is oral progesterone-only contraceptive.  Postpartum depression screening: negative.   The following portions of the patient's history were reviewed and updated as appropriate: allergies, current medications, past family history, past medical history, past social history, past surgical history and problem list.  Patient has been taking BP once a day at home. She has a log, but did not bring it. Recalls it was 140/90 the other day.  She denies headache of vision changes. She had a LE [place] which did not show DVT. The swelling has improved some. She is wearing compression socks.    Review of Systems Pertinent items are noted in HPI.  Objective:    BP 122/80   Pulse 90   Ht 5\' 9"  (1.753 m)   Wt 275 lb (124.7 kg)   LMP 08/14/2018 (Exact Date)   Breastfeeding Yes   BMI 40.61 kg/m   General:  alert and no distress   Breasts:  inspection negative, no nipple discharge or bleeding, no masses or nodularity palpable  Lungs: clear to auscultation bilaterally  Heart:  regular rate and rhythm, S1, S2 normal, no murmur, click, rub or gallop  Abdomen: soft, non-tender; bowel sounds normal; no masses,  no organomegaly.   Well healed Pfannenstiel incision   Vulva:  normal  Vagina: normal vagina, no discharge, exudate, lesion, or erythema  Cervix:  no cervical motion tenderness and no lesions  Corpus: normal  size, contour, position, consistency, mobility, non-tender  Adnexa:  normal adnexa and no mass, fullness, tenderness  Rectal Exam: Not performed.          Assessment:  Post Partum Care visit 1. Pre-eclampsia in postpartum period   - POC Urinalysis Dipstick OB   Plan:   Asked patient to send BP log via mychart so I can review it and increase BP meds if needed. Repeat BP in office is normal. Discussed that patient should report to the hospital if she has severe headache, vision changes, or RUQ pain or BP greater than 160/110. Patient repeated the instructions back. She will continue current BP medication.  Follow up in: 1 week or as needed.   Adrian Prows MD [facility] 06/26/2019 4:01 PM

## 2019-06-26 NOTE — Progress Notes (Signed)
negative

## 2019-06-29 ENCOUNTER — Encounter: Payer: Self-pay | Admitting: Obstetrics and Gynecology

## 2019-06-29 ENCOUNTER — Ambulatory Visit (INDEPENDENT_AMBULATORY_CARE_PROVIDER_SITE_OTHER): Payer: BC Managed Care – PPO | Admitting: Obstetrics and Gynecology

## 2019-06-29 ENCOUNTER — Other Ambulatory Visit: Payer: Self-pay

## 2019-06-29 DIAGNOSIS — O1495 Unspecified pre-eclampsia, complicating the puerperium: Secondary | ICD-10-CM

## 2019-06-29 DIAGNOSIS — M7989 Other specified soft tissue disorders: Secondary | ICD-10-CM

## 2019-06-29 NOTE — Progress Notes (Signed)
   OBSTETRICS POSTPARTUM CLINIC PROGRESS NOTE  Subjective:     Natasha Alvarez is a 28 y.o. G21P1001 female who presents for a postpartum visit. She is 3 weeks postpartum following a Term pregnancy and delivery by C-section failure to progress.  I have fully reviewed the prenatal and intrapartum course. Anesthesia: epidural.  Postpartum course has been complicated by complicated by pre-eclampsia.  Baby is feeding by Bottle and Breast.  Bleeding: patient has not  resumed menses.  Bowel function is normal. Bladder function is normal.  Patient is not sexually active.   Patient doing well, no issues. Denies headaches or vision changes.  BP WNL here today and at home. Successful breastfeeding. Boots have been fitting easier. She is wearing support socks at home.  The following portions of the patient's history were reviewed and updated as appropriate: allergies, current medications, past family history, past medical history, past social history, past surgical history and problem list.  Review of Systems Pertinent items are noted in HPI.  Objective:    BP 118/72   Pulse 87   Ht 5\' 9"  (1.753 m)   Wt 273 lb (123.8 kg)   LMP 08/14/2018 (Exact Date)   Breastfeeding No   BMI 40.32 kg/m   General:  alert and no distress   Breasts:  inspection negative, no nipple discharge or bleeding, no masses or nodularity palpable  Lungs: clear to auscultation bilaterally  Heart:  regular rate and rhythm, S1, S2 normal, no murmur, click, rub or gallop  Abdomen: soft, non-tender; bowel sounds normal; no masses,  no organomegaly.   Well healed Pfannenstiel incision   Vulva:  normal  Vagina: normal vagina, no discharge, exudate, lesion, or erythema  Cervix:  no cervical motion tenderness and no lesions  Corpus: normal size, contour, position, consistency, mobility, non-tender  Adnexa:  normal adnexa and no mass, fullness, tenderness  Rectal Exam: Not performed.        Continued improvement in bilateral  leg edema.  Assessment:  Post Partum Care visit There are no diagnoses linked to this encounter.  Plan:  See orders and Patient Instructions Follow up in: 2 weeks or as needed.   Continue daily BP checks at home. If BP less than 100/50 can discontinue medications.  Will return in 2 weeks.   Adrian Prows MD [facility] 06/29/2019 9:54 AM

## 2019-07-13 ENCOUNTER — Encounter: Payer: Self-pay | Admitting: Obstetrics and Gynecology

## 2019-07-13 ENCOUNTER — Ambulatory Visit (INDEPENDENT_AMBULATORY_CARE_PROVIDER_SITE_OTHER): Payer: BC Managed Care – PPO | Admitting: Obstetrics and Gynecology

## 2019-07-13 ENCOUNTER — Other Ambulatory Visit: Payer: Self-pay

## 2019-07-13 VITALS — BP 150/90 | Ht 69.0 in | Wt 271.0 lb

## 2019-07-13 DIAGNOSIS — O1495 Unspecified pre-eclampsia, complicating the puerperium: Secondary | ICD-10-CM

## 2019-07-13 DIAGNOSIS — Z1389 Encounter for screening for other disorder: Secondary | ICD-10-CM

## 2019-07-13 NOTE — Progress Notes (Signed)
Postpartum Visit  Chief Complaint:  Chief Complaint  Patient presents with  . Postpartum Care    Been bleeding since she delivered, wants to be put on [facility] but unsure of type     History of Present Illness: Patient is a 28 y.o. G1P1001 presents for postpartum visit.  Review the Delivery Report for details.  Date of delivery:  Information for the patient's newborn:  Elson Clan [[id number]]  06/08/2018   Type of delivery: Primary cesarean section  Breast Feeding:  yes Lochia: spotting Post partum depression/anxiety noted:  no Edinburgh Post-Partum Depression Score:  0  Date of last PAP: 11/09/2018  normal  Any problems since the delivery:  Elevated BP and preeclampsia, taking Norvasc 5mg  daily  Newborn Details:  SINGLETON :  1. BabyGender:Female. Birth weight:  Information for the patient's newborn:  Jamar Blanchard Mane  7 lb 5.8 oz (3.34 kg)   Infant Status: Infant doing well at home with mother.   Review of Systems: ROS   Past Medical History:  No past medical history on file.  Past Surgical History:  Past Surgical History:  Procedure Laterality Date  . CESAREAN SECTION N/A 06/08/2019   Procedure: CESAREAN SECTION;  Surgeon: 08/08/2019, MD;  Location: [facility] ORS;  Service: Obstetrics;  Laterality: N/A;  TOB: 0507 Natale Milch WEIGHT: 7# 6OZ APRGAR 1MIN:9 5MIN:9  . NO PAST SURGERIES    . NO PAST SURGERIES      Family History:  Family History  Problem Relation Age of Onset  . Hypertension Mother   . Breast cancer Neg Hx   . Colon cancer Neg Hx   . Ovarian cancer Neg Hx   . Diabetes Neg Hx     Social History:  Social History   Socioeconomic History  . Marital status: Single    Spouse name: Not on file  . Number of children: Not on file  . Years of education: Not on file  . Highest education level: Not on file  Occupational History  . Not on file  Social Needs  . Financial resource strain: Not on file  . Food  insecurity    Worry: Not on file    Inability: Not on file  . Transportation needs    Medical: Not on file    Non-medical: Not on file  Tobacco Use  . Smoking status: Former Smoker    Quit date: 07/01/2018    Years since quitting: 1.0  . Smokeless tobacco: Never Used  . Tobacco comment: 1-2 cigs/wk x 2 months  Substance and Sexual Activity  . Alcohol use: No  . Drug use: No  . Sexual activity: Not Currently    Partners: Male    Birth control/protection: None  Lifestyle  . Physical activity    Days per week: Not on file    Minutes per session: Not on file  . Stress: Not on file  Relationships  . Social 13/08/2017 on phone: Not on file    Gets together: Not on file    Attends religious service: Not on file    Active member of club or organization: Not on file    Attends meetings of clubs or organizations: Not on file    Relationship status: Not on file  . Intimate partner violence    Fear of current or ex partner: Not on file    Emotionally abused: Not on file    Physically abused: Not on file  Forced sexual activity: Not on file  Other Topics Concern  . Not on file  Social History Narrative  . Not on file    Allergies:  Allergies  Allergen Reactions  . Amoxicillin Anaphylaxis    Medications: Prior to Admission medications   Medication Sig Start Date End Date Taking? Authorizing Provider  amLODipine (NORVASC) 5 MG tablet Take 1 tablet (5 mg total) by mouth daily. 06/15/19  Yes Lendon George R, MD  Prenatal Vit-Fe Fumarate-FA (MULTIVITAMIN-PRENATAL) 27-0.8 MG TABS tablet Take 1 tablet by mouth daily at 12 noon.   Yes [provider]  Elastic Bandages & Supports (MEDICAL COMPRESSION SOCKS) MISC 1 each by Does not apply route daily. Patient not taking: Reported on 07/13/2019 06/19/19   Homero Fellers, MD    Physical Exam Vitals:  Vitals:   07/13/19 1613  BP: (!) 150/90    OBGyn Exam  Assessment: 28 y.o. G1P1001 presenting  for 6 week postpartum visit  Plan: Problem List Items Addressed This Visit    None       1) Contraception Education given regarding options for contraception, including paragard, nexplanon, IUD- she will consider and follow up next week for insertion.  2)  Pap: ASCCP guidelines and rational discussed.  Patient opts for 3 year screening interval  3) Patient underwent screening for postpartum depression with no concerns noted.   - Follow up 1 week for routine birth control insertion  Adrian Prows MD [facility] 07/13/2019 4:41 PM

## 2019-07-13 NOTE — Patient Instructions (Signed)
[email or web address]

## 2019-07-19 ENCOUNTER — Other Ambulatory Visit: Payer: Self-pay

## 2019-07-19 ENCOUNTER — Ambulatory Visit (INDEPENDENT_AMBULATORY_CARE_PROVIDER_SITE_OTHER): Payer: BC Managed Care – PPO | Admitting: Obstetrics and Gynecology

## 2019-07-19 ENCOUNTER — Encounter: Payer: Self-pay | Admitting: Obstetrics and Gynecology

## 2019-07-19 VITALS — BP 112/72 | HR 98 | Ht 69.0 in | Wt 271.0 lb

## 2019-07-19 DIAGNOSIS — Z30430 Encounter for insertion of intrauterine contraceptive device: Secondary | ICD-10-CM

## 2019-07-19 DIAGNOSIS — Z3202 Encounter for pregnancy test, result negative: Secondary | ICD-10-CM | POA: Diagnosis not present

## 2019-07-19 DIAGNOSIS — N912 Amenorrhea, unspecified: Secondary | ICD-10-CM

## 2019-07-19 LAB — POCT URINE PREGNANCY: Preg Test, Ur: NEGATIVE

## 2019-07-19 MED ORDER — PARAGARD INTRAUTERINE COPPER IU IUD: INTRAUTERINE_SYSTEM | Freq: Once | INTRAUTERINE | Status: AC

## 2019-07-19 NOTE — Progress Notes (Signed)
  IUD PROCEDURE NOTE:  Natasha Alvarez is a 28 y.o. G1P1001 here for IUD insertion. No GYN concerns.  Last pap smear was normal.  IUD Insertion Procedure Note Patient identified, informed consent performed, consent signed.   Discussed risks of irregular bleeding, cramping, infection, malpositioning or misplacement of the IUD outside the uterus which may require further procedure such as laparoscopy, risk of failure <1%. Time out was performed.  Urine pregnancy test negative.  A bimanual exam showed the uterus to be anteverted.  Speculum placed in the vagina.  Cervix visualized.  Cleaned with Betadine x 2.  Grasped anteriorly with a single tooth tenaculum.  Uterus sounded to 10 cm.   IUD placed per manufacturer's recommendations.  Strings trimmed to 3 cm. Tenaculum was removed, good hemostasis noted.  Patient tolerated procedure well.   Patient was given post-procedure instructions.  She was advised to have backup contraception for one week.  Patient was also asked to check IUD strings periodically and follow up in 4 weeks for IUD check.  Adrian Prows MD [facility] 07/19/2019 11:33 AM

## 2019-07-19 NOTE — Progress Notes (Signed)
Patient desires non hormonal IUD (Paragard)

## 2019-08-02 ENCOUNTER — Other Ambulatory Visit: Payer: Self-pay | Admitting: Obstetrics and Gynecology

## 2019-08-02 ENCOUNTER — Telehealth: Payer: Self-pay

## 2019-08-02 NOTE — Telephone Encounter (Signed)
Pt is needing a note from the provider to be released back to work with no restrictions on 08/03/19. Pt aware you are in office tomorrow. Thank you

## 2019-08-02 NOTE — Telephone Encounter (Signed)
Sent via mychart

## 2019-08-03 NOTE — Telephone Encounter (Signed)
Pt has picked up letter. 

## 2019-08-17 ENCOUNTER — Other Ambulatory Visit: Payer: Self-pay

## 2019-08-17 ENCOUNTER — Encounter: Payer: Self-pay | Admitting: Obstetrics and Gynecology

## 2019-08-17 ENCOUNTER — Ambulatory Visit (INDEPENDENT_AMBULATORY_CARE_PROVIDER_SITE_OTHER): Payer: BC Managed Care – PPO | Admitting: Obstetrics and Gynecology

## 2019-08-17 VITALS — BP 126/82 | HR 73 | Ht 69.0 in | Wt 273.0 lb

## 2019-08-17 DIAGNOSIS — Z30431 Encounter for routine checking of intrauterine contraceptive device: Secondary | ICD-10-CM

## 2019-08-17 NOTE — Progress Notes (Signed)
  History of Present Illness:  Natasha Alvarez is a 28 y.o. that had a Paragard IUD placed approximately 4 weeks ago. Since that time, she states that she has had some heavy bleeding with per period. Had to wear a tampon and pad.   PMHx: She  has no past medical history on file. Also,  has a past surgical history that includes No past surgeries; No past surgeries; and Cesarean section (N/A, 06/08/2019)., family history includes Hypertension in her mother.,  reports that she quit smoking about 13 months ago. She has never used smokeless tobacco. She reports that she does not drink alcohol or use drugs. No outpatient medications have been marked as taking for the 08/17/19 encounter (Office Visit) with Natasha Fellers, MD.   Current Facility-Administered Medications for the 08/17/19 encounter (Office Visit) with Natasha Fellers, MD  Medication  . paragard intrauterine copper IUD  .  Also, is allergic to amoxicillin..  Review of Systems  Constitutional: Negative for chills, fever, malaise/fatigue and weight loss.  HENT: Negative for congestion, hearing loss and sinus pain.   Eyes: Negative for blurred vision and double vision.  Respiratory: Negative for cough, sputum production, shortness of breath and wheezing.   Cardiovascular: Negative for chest pain, palpitations, orthopnea and leg swelling.  Gastrointestinal: Negative for abdominal pain, constipation, diarrhea, nausea and vomiting.  Genitourinary: Negative for dysuria, flank pain, frequency, hematuria and urgency.  Musculoskeletal: Negative for back pain, falls and joint pain.  Skin: Negative for itching and rash.  Neurological: Negative for dizziness and headaches.  Psychiatric/Behavioral: Negative for depression, substance abuse and suicidal ideas. The patient is not nervous/anxious.     Physical Exam:  BP 126/82   Pulse 73   Ht 5\' 9"  (1.753 m)   Wt 273 lb (123.8 kg)   LMP 08/14/2018 (Exact Date)   Breastfeeding Yes    BMI 40.32 kg/m  Body mass index is 40.32 kg/m. Constitutional: Well nourished, well developed female in no acute distress.  Abdomen: diffusely non tender to palpation, non distended, and no masses, hernias Neuro: Grossly intact Psych:  Normal mood and affect.    Pelvic exam:  Two IUD strings present seen coming from the cervical os. EGBUS, vaginal vault and cervix: within normal limits  Assessment: IUD strings present in proper location; pt doing well  Plan: She was told to continue to use barrier contraception, in order to prevent any STIs, and to take a home pregnancy test or call us if she ever thinks she may be pregnant, and that her IUD expires in 10 years.  She was amenable to this plan and we will see her back in 1 year/PRN. Discussed using NSAIDs during period to reduce bleeding.   A total of 15 minutes were spent face-to-face with the patient during this encounter and over half of that time dealt with counseling and coordination of care.  Natasha Prows MD [facility] 08/17/2019 10:07 AM

## 2019-10-10 ENCOUNTER — Encounter: Payer: Self-pay | Admitting: Obstetrics and Gynecology

## 2019-10-18 DIAGNOSIS — Z20828 Contact with and (suspected) exposure to other viral communicable diseases: Secondary | ICD-10-CM | POA: Diagnosis not present

## 2019-10-18 DIAGNOSIS — Z03818 Encounter for observation for suspected exposure to other biological agents ruled out: Secondary | ICD-10-CM | POA: Diagnosis not present

## 2019-10-23 ENCOUNTER — Ambulatory Visit: Payer: BC Managed Care – PPO | Attending: Internal Medicine

## 2019-10-23 DIAGNOSIS — Z20822 Contact with and (suspected) exposure to covid-19: Secondary | ICD-10-CM | POA: Diagnosis not present

## 2019-10-24 LAB — NOVEL CORONAVIRUS, NAA: SARS-CoV-2, NAA: NOT DETECTED

## 2019-10-25 ENCOUNTER — Telehealth: Payer: Self-pay | Admitting: *Deleted

## 2019-10-25 NOTE — Telephone Encounter (Signed)
Patient called given negative covid results . 

## 2020-02-20 ENCOUNTER — Other Ambulatory Visit: Payer: Self-pay

## 2020-02-20 ENCOUNTER — Encounter: Payer: Self-pay | Admitting: Obstetrics and Gynecology

## 2020-02-20 ENCOUNTER — Ambulatory Visit (INDEPENDENT_AMBULATORY_CARE_PROVIDER_SITE_OTHER): Payer: BC Managed Care – PPO | Admitting: Obstetrics and Gynecology

## 2020-02-20 VITALS — BP 110/80 | Ht 69.0 in | Wt 275.0 lb

## 2020-02-20 DIAGNOSIS — Z30431 Encounter for routine checking of intrauterine contraceptive device: Secondary | ICD-10-CM

## 2020-02-20 NOTE — Patient Instructions (Signed)
I value your feedback and entrusting us with your care. If you get a [facility] patient survey, I would appreciate you taking the time to let us know about your experience today. Thank you!  As of August 10, 2019, your lab results will be released to your MyChart immediately, before I even have a chance to see them. Please give me time to review them and contact you if there are any abnormalities. Thank you for your patience.  

## 2020-02-20 NOTE — Progress Notes (Signed)
Patient, No Pcp Per   Chief Complaint  Patient presents with  . IUD check    went to bathroom last night and noticed IUD strings in toilet, usually checks and feels strings, does not feel them now, no abnormal pain    HPI:      Ms. Natasha Alvarez is a 29 y.o. G1P1001 whose LMP was Patient's last menstrual period was 02/02/2020 (exact date)., presents today for IUD check. Noted a 3-4 inch piece of string in toilet last night, about width of tampon string. No IUD device seen. Usually checks IUD strings regularly, last checked 3 days ago but not felt since strings seen in toilet.  Paragard placed 11/20 PP.  Menses are monthly, lasting 7-12 days, mod flow, no BTB, mild dysmen. She is sex active, no pain/bleeding with sex.   History reviewed. No pertinent past medical history.  Past Surgical History:  Procedure Laterality Date  . CESAREAN SECTION N/A 06/08/2019   Procedure: CESAREAN SECTION;  Surgeon: Homero Fellers, MD;  Location: [facility] ORS;  Service: Obstetrics;  Laterality: N/A;  TOB: 0507 BPZ:WCHE WEIGHT: 7# 5ID APRGAR 1MIN:9 5MIN:9  . NO PAST SURGERIES    . NO PAST SURGERIES      Family History  Problem Relation Age of Onset  . Hypertension Mother   . Breast cancer Neg Hx   . Colon cancer Neg Hx   . Ovarian cancer Neg Hx   . Diabetes Neg Hx     Social History   Socioeconomic History  . Marital status: Single    Spouse name: Not on file  . Number of children: Not on file  . Years of education: Not on file  . Highest education level: Not on file  Occupational History  . Not on file  Tobacco Use  . Smoking status: Former Smoker    Quit date: 07/01/2018    Years since quitting: 1.6  . Smokeless tobacco: Never Used  . Tobacco comment: 1-2 cigs/wk x 2 months  Vaping Use  . Vaping Use: Never used  Substance and Sexual Activity  . Alcohol use: No  . Drug use: No  . Sexual activity: Yes    Partners: Male    Birth control/protection: I.U.D.    Comment:  Paragard  Other Topics Concern  . Not on file  Social History Narrative  . Not on file   Social Determinants of Health   Financial Resource Strain:   . Difficulty of Paying Living Expenses:   Food Insecurity:   . Worried About Charity fundraiser in the Last Year:   . Arboriculturist in the Last Year:   Transportation Needs:   . Film/video editor (Medical):   Marland Kitchen Lack of Transportation (Non-Medical):   Physical Activity:   . Days of Exercise per Week:   . Minutes of Exercise per Session:   Stress:   . Feeling of Stress :   Social Connections:   . Frequency of Communication with Friends and Family:   . Frequency of Social Gatherings with Friends and Family:   . Attends Religious Services:   . Active Member of Clubs or Organizations:   . Attends Archivist Meetings:   Marland Kitchen Marital Status:   Intimate Partner Violence:   . Fear of Current or Ex-Partner:   . Emotionally Abused:   Marland Kitchen Physically Abused:   . Sexually Abused:     Outpatient Medications Prior to Visit  Medication Sig Dispense Refill  .  Prenatal Vit-Fe Fumarate-FA (MULTIVITAMIN-PRENATAL) 27-0.8 MG TABS tablet Take 1 tablet by mouth daily at 12 noon.    Marland Kitchen amLODipine (NORVASC) 5 MG tablet Take 1 tablet (5 mg total) by mouth daily. 30 tablet 2  . Elastic Bandages & Supports (MEDICAL COMPRESSION SOCKS) MISC 1 each by Does not apply route daily. 2 each 0   Facility-Administered Medications Prior to Visit  Medication Dose Route Frequency Provider Last Rate Last Admin  . paragard intrauterine copper IUD   Intrauterine Once Schuman, Christanna R, MD          ROS:  Review of Systems  Constitutional: Negative for fever.  Gastrointestinal: Negative for blood in stool, constipation, diarrhea, nausea and vomiting.  Genitourinary: Negative for dyspareunia, dysuria, flank pain, frequency, hematuria, urgency, vaginal bleeding, vaginal discharge and vaginal pain.  Musculoskeletal: Negative for back pain.  Skin:  Negative for rash.    OBJECTIVE:   Vitals:  BP 110/80   Ht 5\' 9"  (1.753 m)   Wt 275 lb (124.7 kg)   LMP 02/02/2020 (Exact Date)   Breastfeeding No   BMI 40.61 kg/m   Physical Exam Vitals reviewed.  Constitutional:      Appearance: She is well-developed.  Pulmonary:     Effort: Pulmonary effort is normal.  Genitourinary:    General: Normal vulva.     Pubic Area: No rash.      Labia:        Right: No rash, tenderness or lesion.        Left: No rash, tenderness or lesion.      Vagina: Normal. No vaginal discharge, erythema or tenderness.     Cervix: Normal.     Uterus: Normal. Not enlarged and not tender.      Adnexa: Right adnexa normal and left adnexa normal.       Right: No mass or tenderness.         Left: No mass or tenderness.       Comments: IUD STRINGS IN CX OS Musculoskeletal:        General: Normal range of motion.     Cervical back: Normal range of motion.  Skin:    General: Skin is warm and dry.  Neurological:     General: No focal deficit present.     Mental Status: She is alert and oriented to person, place, and time.  Psychiatric:        Mood and Affect: Mood normal.        Behavior: Behavior normal.        Thought Content: Thought content normal.        Judgment: Judgment normal.     Assessment/Plan: Encounter for routine checking of intrauterine contraceptive device (IUD)--IUD strings seen in cx os. Reassurance. Given width and length of string in toilet, not c/w IUD strings. Pt feels better. F/u prn.     Return if symptoms worsen or fail to improve.  Ethyle Tiedt B. Keiondra Brookover, PA-C 02/20/2020 10:34 AM

## 2020-03-21 ENCOUNTER — Other Ambulatory Visit: Payer: Self-pay

## 2020-03-21 ENCOUNTER — Ambulatory Visit (INDEPENDENT_AMBULATORY_CARE_PROVIDER_SITE_OTHER): Payer: BC Managed Care – PPO | Admitting: Advanced Practice Midwife

## 2020-03-21 ENCOUNTER — Encounter: Payer: Self-pay | Admitting: Advanced Practice Midwife

## 2020-03-21 VITALS — BP 128/74 | Ht 69.0 in | Wt 268.0 lb

## 2020-03-21 DIAGNOSIS — R350 Frequency of micturition: Secondary | ICD-10-CM

## 2020-03-21 DIAGNOSIS — R3915 Urgency of urination: Secondary | ICD-10-CM | POA: Diagnosis not present

## 2020-03-21 DIAGNOSIS — R300 Dysuria: Secondary | ICD-10-CM | POA: Diagnosis not present

## 2020-03-21 LAB — POCT URINALYSIS DIPSTICK
Bilirubin, UA: NEGATIVE
Blood, UA: NEGATIVE
Glucose, UA: NEGATIVE
Ketones, UA: NEGATIVE
Leukocytes, UA: NEGATIVE
Nitrite, UA: NEGATIVE
Protein, UA: NEGATIVE
Spec Grav, UA: 1.02 (ref 1.010–1.025)
Urobilinogen, UA: NEGATIVE E.U./dL — AB
pH, UA: 7 (ref 5.0–8.0)

## 2020-03-21 NOTE — Progress Notes (Signed)
Patient ID: Kathee Polite, female   DOB: 1990/10/04, 29 y.o.   MRN: [id number]  Reason for Consult: Dysuria and Pelvic Pain   Subjective:  HPI:  NELSIE DOMINO is a 29 y.o. female being seen for symptoms of UTI that began 2 days ago. She has had burning with urination and yesterday had some frequency and urgency. In addition she has stomach cramping and low back pain. She denies any vaginal symptoms. She admits drinking about a gallon of water per day. She also has sweet tea daily. She has increased stress currently due to possible RSV in her infant.  We discussed normal UA today with the exception that the color of the urine is slightly darker than light yellow. Suggested she try AZO products while awaiting urine culture results.  No past medical history on file. Family History  Problem Relation Age of Onset  . Hypertension Mother   . Breast cancer Neg Hx   . Colon cancer Neg Hx   . Ovarian cancer Neg Hx   . Diabetes Neg Hx    Past Surgical History:  Procedure Laterality Date  . CESAREAN SECTION N/A 06/08/2019   Procedure: CESAREAN SECTION;  Surgeon: Natale Milch, MD;  Location: [facility] ORS;  Service: Obstetrics;  Laterality: N/A;  TOB: 0507 YJE:HUDJ WEIGHT: 7# 6OZ APRGAR 1MIN:9 5MIN:9  . NO PAST SURGERIES    . NO PAST SURGERIES      Short Social History:  Social History   Tobacco Use  . Smoking status: Former Smoker    Quit date: 07/01/2018    Years since quitting: 1.7  . Smokeless tobacco: Never Used  . Tobacco comment: 1-2 cigs/wk x 2 months  Substance Use Topics  . Alcohol use: No    Allergies  Allergen Reactions  . Amoxicillin Anaphylaxis    Current Outpatient Medications  Medication Sig Dispense Refill  . Prenatal Vit-Fe Fumarate-FA (MULTIVITAMIN-PRENATAL) 27-0.8 MG TABS tablet Take 1 tablet by mouth daily at 12 noon.     Current Facility-Administered Medications  Medication Dose Route Frequency Provider Last Rate Last Admin  . paragard  intrauterine copper IUD   Intrauterine Once Schuman, Christanna R, MD       Review of Systems  Constitutional: Positive for malaise/fatigue. Negative for chills and fever.  HENT: Negative for congestion, ear discharge, ear pain, hearing loss, sinus pain and sore throat.   Eyes: Negative for blurred vision and double vision.  Respiratory: Negative for cough, shortness of breath and wheezing.   Cardiovascular: Negative for chest pain, palpitations and leg swelling.  Gastrointestinal: Positive for abdominal pain. Negative for blood in stool, constipation, diarrhea, heartburn, melena, nausea and vomiting.  Genitourinary: Positive for dysuria, frequency and urgency. Negative for flank pain and hematuria.  Musculoskeletal: Negative for back pain, joint pain and myalgias.       Positive for low back pain  Skin: Negative for itching and rash.  Neurological: Positive for headaches. Negative for dizziness, tingling, tremors, sensory change, speech change, focal weakness, seizures, loss of consciousness and weakness.  Endo/Heme/Allergies: Negative for environmental allergies. Does not bruise/bleed easily.  Psychiatric/Behavioral: Negative for depression, hallucinations, memory loss, substance abuse and suicidal ideas. The patient is not nervous/anxious and does not have insomnia.        Objective:  Objective   Vitals:   03/21/20 0959  BP: 128/74  Weight: 268 lb (121.6 kg)  Height: 5\' 9"  (1.753 m)   Body mass index is 39.58 kg/m. Constitutional: Well nourished, well developed female  in no acute distress.  HEENT: normal Skin: Warm and dry.  Respiratory:  Normal respiratory effort Back: no CVAT Neuro: DTRs 2+, Cranial nerves grossly intact Psych: Alert and Oriented x3. No memory deficits. Normal mood and affect.  MS: normal gait, normal bilateral lower extremity ROM/strength/stability.  Data: Results for BLESSED, GIRDNER (MRN [id number]) as of 03/21/2020 10:26  Ref. Range 03/21/2020 10:13    Bilirubin, UA Unknown neg  Clarity, UA Unknown clear  Glucose Latest Ref Range: Negative  Negative  Ketones, UA Unknown neg  Leukocytes,UA Latest Ref Range: Negative  Negative  Nitrite, UA Unknown neg  pH, UA Latest Ref Range: 5.0 - 8.0  7.0  Protein,UA Latest Ref Range: Negative  Negative  Specific Gravity, UA Latest Ref Range: 1.010 - 1.025  1.020  Urobilinogen, UA Latest Ref Range: 0.2 or 1.0 E.U./dL negative (A)  RBC, UA Unknown neg       Assessment/Plan:     Urine culture Follow up as needed after culture results AZO products while waiting for results   [name] CNM [facility] 03/21/2020, 10:28 AM

## 2020-03-23 LAB — URINE CULTURE: Organism ID, Bacteria: NO GROWTH

## 2020-03-25 DIAGNOSIS — J00 Acute nasopharyngitis [common cold]: Secondary | ICD-10-CM | POA: Diagnosis not present

## 2020-03-25 DIAGNOSIS — Z03818 Encounter for observation for suspected exposure to other biological agents ruled out: Secondary | ICD-10-CM | POA: Diagnosis not present

## 2021-11-24 ENCOUNTER — Encounter: Payer: Self-pay | Admitting: Emergency Medicine

## 2021-11-24 ENCOUNTER — Emergency Department
Admission: EM | Admit: 2021-11-24 | Discharge: 2021-11-24 | Disposition: A | Discharge status: Home / Self Care | Payer: Medicaid Other | Attending: Emergency Medicine | Admitting: Emergency Medicine

## 2021-11-24 ENCOUNTER — Other Ambulatory Visit: Payer: Self-pay

## 2021-11-24 DIAGNOSIS — S39012A Strain of muscle, fascia and tendon of lower back, initial encounter: Secondary | ICD-10-CM | POA: Diagnosis not present

## 2021-11-24 DIAGNOSIS — Y92410 Unspecified street and highway as the place of occurrence of the external cause: Secondary | ICD-10-CM | POA: Diagnosis not present

## 2021-11-24 DIAGNOSIS — R0981 Nasal congestion: Secondary | ICD-10-CM | POA: Diagnosis not present

## 2021-11-24 DIAGNOSIS — S8992XA Unspecified injury of left lower leg, initial encounter: Secondary | ICD-10-CM | POA: Diagnosis not present

## 2021-11-24 DIAGNOSIS — S3992XA Unspecified injury of lower back, initial encounter: Secondary | ICD-10-CM | POA: Diagnosis present

## 2021-11-24 DIAGNOSIS — S8991XA Unspecified injury of right lower leg, initial encounter: Secondary | ICD-10-CM | POA: Insufficient documentation

## 2021-11-24 MED ORDER — NAPROXEN 500 MG PO TABS: 500.0000 mg | ORAL_TABLET | Freq: Two times a day (BID) | ORAL | 2 refills | Status: DC
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
## 2022-02-18 ENCOUNTER — Emergency Department: Payer: Medicaid Other

## 2022-02-18 ENCOUNTER — Emergency Department
Admission: EM | Admit: 2022-02-18 | Discharge: 2022-02-18 | Disposition: A | Discharge status: Home / Self Care | Payer: Medicaid Other | Attending: Emergency Medicine | Admitting: Emergency Medicine

## 2022-02-18 ENCOUNTER — Other Ambulatory Visit: Payer: Self-pay

## 2022-02-18 DIAGNOSIS — M549 Dorsalgia, unspecified: Secondary | ICD-10-CM | POA: Diagnosis present

## 2022-02-18 DIAGNOSIS — M546 Pain in thoracic spine: Secondary | ICD-10-CM | POA: Diagnosis not present

## 2022-02-18 DIAGNOSIS — X500XXA Overexertion from strenuous movement or load, initial encounter: Secondary | ICD-10-CM | POA: Insufficient documentation

## 2022-02-18 LAB — URINALYSIS, ROUTINE W REFLEX MICROSCOPIC
Bilirubin Urine: NEGATIVE
Glucose, UA: NEGATIVE mg/dL
Ketones, ur: NEGATIVE mg/dL
Nitrite: NEGATIVE
Protein, ur: NEGATIVE mg/dL
Specific Gravity, Urine: 1.001 — ABNORMAL LOW (ref 1.005–1.030)
pH: 8 (ref 5.0–8.0)

## 2022-02-18 LAB — POC URINE PREG, ED: Preg Test, Ur: NEGATIVE

## 2022-02-18 MED ORDER — HYDROCODONE-ACETAMINOPHEN 5-325 MG PO TABS: 1.0000 | ORAL_TABLET | Freq: Four times a day (QID) | ORAL | 0 refills | Status: AC | PRN

## 2022-02-18 MED ORDER — METHOCARBAMOL 500 MG PO TABS: 500.0000 mg | ORAL_TABLET | Freq: Four times a day (QID) | ORAL | 0 refills | Status: AC

## 2022-02-18 MED ORDER — NAPROXEN 500 MG PO TABS: 500.0000 mg | ORAL_TABLET | Freq: Two times a day (BID) | ORAL | 0 refills | Status: AC

## 2022-02-18 NOTE — ED Provider Notes (Signed)
[facility] Provider Note    Event Date/Time   First MD Initiated Contact with Patient 02/18/22 [phone]     (approximate)   History   Back Pain   HPI  Natasha Alvarez is a 31 y.o. female   presents to the ED with complaint back pain.  Patient states that she was here last evening with her child and when she got home she went to lift the child and had pain in her back.  She states she has a history of back pain since 2020 when she had an epidural done for labor and delivery.  Patient does not have a primary care provider.  No urinary symptoms or incontinence of bowel or bladder.      Physical Exam   Triage Vital Signs: ED Triage Vitals  Enc Vitals Group     BP 02/18/22 0835 139/76     Pulse Rate 02/18/22 0835 62     Resp 02/18/22 0835 18     Temp 02/18/22 0835 97.7 F (36.5 C)     Temp Source 02/18/22 0835 Oral     SpO2 02/18/22 0835 96 %     Weight 02/18/22 0833 235 lb (106.6 kg)     Height 02/18/22 0833 5\' 9"  (1.753 m)     Head Circumference --      Peak Flow --      Pain Score 02/18/22 0833 10     Pain Loc --      Pain Edu? --      Excl. in GC? --     Most recent vital signs: Vitals:   02/18/22 0835  BP: 139/76  Pulse: 62  Resp: 18  Temp: 97.7 F (36.5 C)  SpO2: 96%     General: Awake, no distress.  CV:  Good peripheral perfusion.  Resp:  Normal effort.  Abd:  No distention.  Other:  Examination of the back there is no gross deformity noted.  No step-offs are appreciated on palpation of the vertebral bodies.  There is tenderness on palpation of the lower thoracic spine.  Skin is intact.  Range of motion is slow and guarded secondary to muscle spasms.   ED Results / Procedures / Treatments   Labs (all labs ordered are listed, but only abnormal results are displayed) Labs Reviewed  URINALYSIS, ROUTINE W REFLEX MICROSCOPIC - Abnormal; Notable for the following components:      Result Value   Color, Urine STRAW (*)     APPearance HAZY (*)    Specific Gravity, Urine 1.001 (*)    Hgb urine dipstick LARGE (*)    Leukocytes,Ua LARGE (*)    Bacteria, UA RARE (*)    All other components within normal limits  POC URINE PREG, ED      RADIOLOGY Thoracic spine x-ray images were reviewed by myself prior to radiology report and no acute changes were noted.  Radiology report was negative.    PROCEDURES:  Critical Care performed:   Procedures   MEDICATIONS ORDERED IN ED: Medications - No data to display   IMPRESSION / MDM / ASSESSMENT AND PLAN / ED COURSE  I reviewed the triage vital signs and the nursing notes.   Differential diagnosis includes, but is not limited to, thoracic pain, thoracic musculoskeletal strain, urinary tract infection.  31 year old female presents to the ED with complaint of thoracic pain after lifting her child last evening.  She also reports that she has chronic pain from her epidural that  she had during childbirth in 2020.  Urinalysis was unremarkable with exception of some rare bacteria and microscopically had 0-5 RBCs and 0-5 WBCs.  Thoracic x-ray was negative for any acute bony injury.  Pregnancy test was negative.  Patient was discharged with a prescription for methocarbamol 500 mg 4 times daily as needed for muscle spasms, naproxen 500 mg twice daily and hydrocodone every 6 hours as needed pain.  She is to follow-up with orthopedics if any continued problems with her back.  She is also encouraged to use ice or heat to her back as needed for discomfort.      Patient's presentation is most consistent with acute complicated illness / injury requiring diagnostic workup.  FINAL CLINICAL IMPRESSION(S) / ED DIAGNOSES   Final diagnoses:  Acute bilateral thoracic back pain     Rx / DC Orders   ED Discharge Orders          Ordered    methocarbamol (ROBAXIN) 500 MG tablet  4 times daily        02/18/22 1050    naproxen (NAPROSYN) 500 MG tablet  2 times daily with meals         02/18/22 1050    HYDROcodone-acetaminophen (NORCO/VICODIN) 5-325 MG tablet  Every 6 hours PRN        02/18/22 1050             Note:  This document was prepared using Dragon voice recognition software and may include unintentional dictation errors.   Tommi Rumps, PA-C 02/18/22 1602    Arnaldo Natal, MD 02/18/22 [phone]

## 2022-02-18 NOTE — Discharge Instructions (Addendum)
Call make an appoint with Dr. Rosita Kea who is the orthopedist on-call for your ongoing back pain.  Today you are being treated for an acute exacerbation of your back pain with a muscle relaxant, anti-inflammatory and pain medication.  Be aware that she cannot drive or operate machinery while taking the pain medication and the methocarbamol as it could cause drowsiness and increase your risk for injury.  You may use ice or heat to your back as needed for discomfort.  Also the over-the-counter lidocaine patches can also be of relief for your back pain as well.

## 2022-02-18 NOTE — ED Triage Notes (Signed)
Pt in with co lower back pain hx of the same states started after epidural 2 hrs ago that she received during labor. Pt has gotten naproxen for the same without relief. No dysuria or abd pain.
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
## 2022-08-05 ENCOUNTER — Other Ambulatory Visit: Payer: Self-pay

## 2022-08-05 ENCOUNTER — Emergency Department
Admission: EM | Admit: 2022-08-05 | Discharge: 2022-08-05 | Disposition: A | Discharge status: Home / Self Care | Payer: Medicaid Other | Attending: Emergency Medicine | Admitting: Emergency Medicine

## 2022-08-05 DIAGNOSIS — J029 Acute pharyngitis, unspecified: Secondary | ICD-10-CM | POA: Diagnosis not present

## 2022-08-05 DIAGNOSIS — Z1152 Encounter for screening for COVID-19: Secondary | ICD-10-CM | POA: Insufficient documentation

## 2022-08-05 DIAGNOSIS — R0981 Nasal congestion: Secondary | ICD-10-CM | POA: Diagnosis not present

## 2022-08-05 DIAGNOSIS — R519 Headache, unspecified: Secondary | ICD-10-CM | POA: Insufficient documentation

## 2022-08-05 DIAGNOSIS — G43909 Migraine, unspecified, not intractable, without status migrainosus: Secondary | ICD-10-CM

## 2022-08-05 LAB — RESP PANEL BY RT-PCR (FLU A&B, COVID) ARPGX2
Influenza A by PCR: NEGATIVE
Influenza B by PCR: NEGATIVE
SARS Coronavirus 2 by RT PCR: NEGATIVE

## 2022-08-05 MED ORDER — BUTALBITAL-APAP-CAFFEINE 50-325-40 MG PO TABS: 1.0000 | ORAL_TABLET | Freq: Four times a day (QID) | ORAL | 0 refills | Status: AC | PRN

## 2022-08-05 NOTE — ED Provider Notes (Signed)
   [facility] Provider Note    Event Date/Time   First MD Initiated Contact with Patient 08/05/22 1126     (approximate)  History   Chief Complaint: Headache  HPI  Natasha Alvarez is a 31 y.o. female with no significant past medical history presents to the emergency department for left-sided headache and sore throat x3 days.  According to the patient for the past few days she has had a headache affecting left side of her head as well as nasal congestion and a sore throat.  Patient states she took a home COVID test that was negative.  Denies any known fever.  Patient states she has missed work due to the headache.  Physical Exam   Triage Vital Signs: ED Triage Vitals  Enc Vitals Group     BP 08/05/22 1054 117/88     Pulse Rate 08/05/22 1054 60     Resp 08/05/22 1054 16     Temp 08/05/22 1054 98.4 F (36.9 C)     Temp src --      SpO2 08/05/22 1054 98 %     Weight 08/05/22 1055 245 lb (111.1 kg)     Height 08/05/22 1055 5\' 9"  (1.753 m)     Head Circumference --      Peak Flow --      Pain Score 08/05/22 1049 8     Pain Loc --      Pain Edu? --      Excl. in GC? --     Most recent vital signs: Vitals:   08/05/22 1054  BP: 117/88  Pulse: 60  Resp: 16  Temp: 98.4 F (36.9 C)  SpO2: 98%    General: Awake, no distress.  CV:  Good peripheral perfusion.  Regular rate and rhythm  Resp:  Normal effort.  Equal breath sounds bilaterally.  Abd:  No distention.  Soft, nontender.  No rebound or guarding. Other:  Normal tympanic membranes.  Very slight left-sided pharyngeal erythema on exam without exudate or tonsillar hypertrophy.   ED Results / Procedures / Treatments   MEDICATIONS ORDERED IN ED: Medications - No data to display   IMPRESSION / MDM / ASSESSMENT AND PLAN / ED COURSE  I reviewed the triage vital signs and the nursing notes.  Patient's presentation is most consistent with acute presentation with potential threat to life or bodily  function.  Patient presents emergency department for 3 days a headache states nasal congestion and left-sided sore throat.  Very slight pharyngeal erythema on exam the left side without tonsillar hypertrophy or exudate.  We will obtain a COVID/flu test.  We will prescribe Fioricet for the patient's headache which could be either tension versus sinus related.  I also discussed over-the-counter pseudoephedrine as well as Tylenol or ibuprofen as needed as written on the box.  Patient appears very well on examination.  Reassuring vital signs.  We will discharge patient home with Fioricet and supportive care including over-the-counter pseudoephedrine if needed.  Patient agreeable to plan of care.  FINAL CLINICAL IMPRESSION(S) / ED DIAGNOSES   Headache  Rx / DC Orders   Fioricet  Note:  This document was prepared using Dragon voice recognition software and may include unintentional dictation errors.   14/06/23, MD 08/05/22 1213

## 2022-08-05 NOTE — ED Triage Notes (Signed)
Pt comes with c/o headache for few days.

## 2022-09-20 IMAGING — CR DG THORACIC SPINE 2V
1 series · 2 of 2 positions shown · non-contrast
Comparison: None Available.

CLINICAL DATA: Pain

EXAM:
THORACIC SPINE 2 VIEWS

[Series 1: w thoracic spine ap · 0.14mm/px · 2 of 2 slices shown]
[im 1/2]
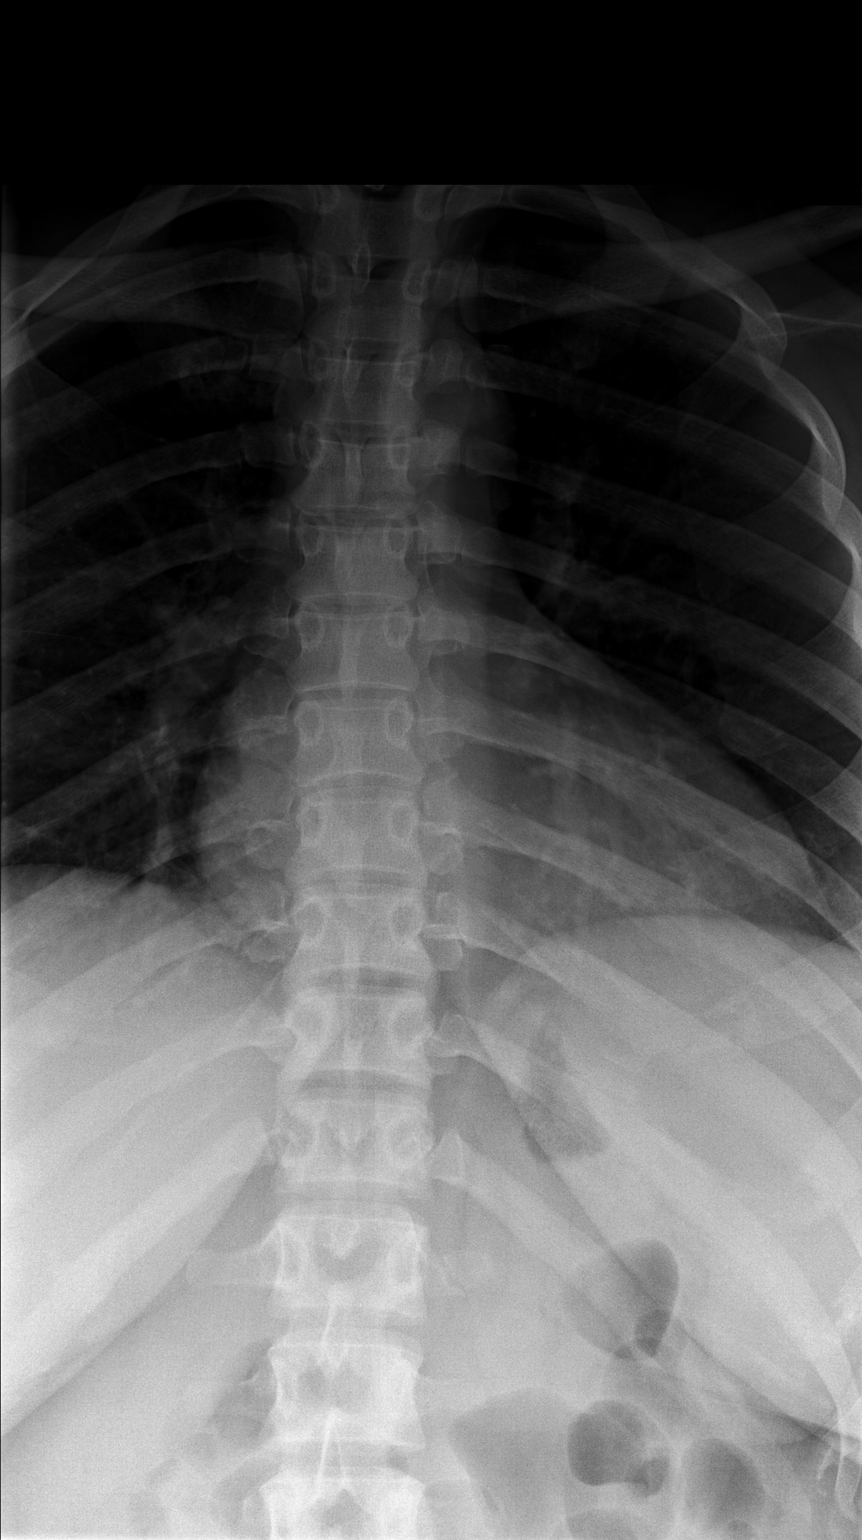
[im 2/2]
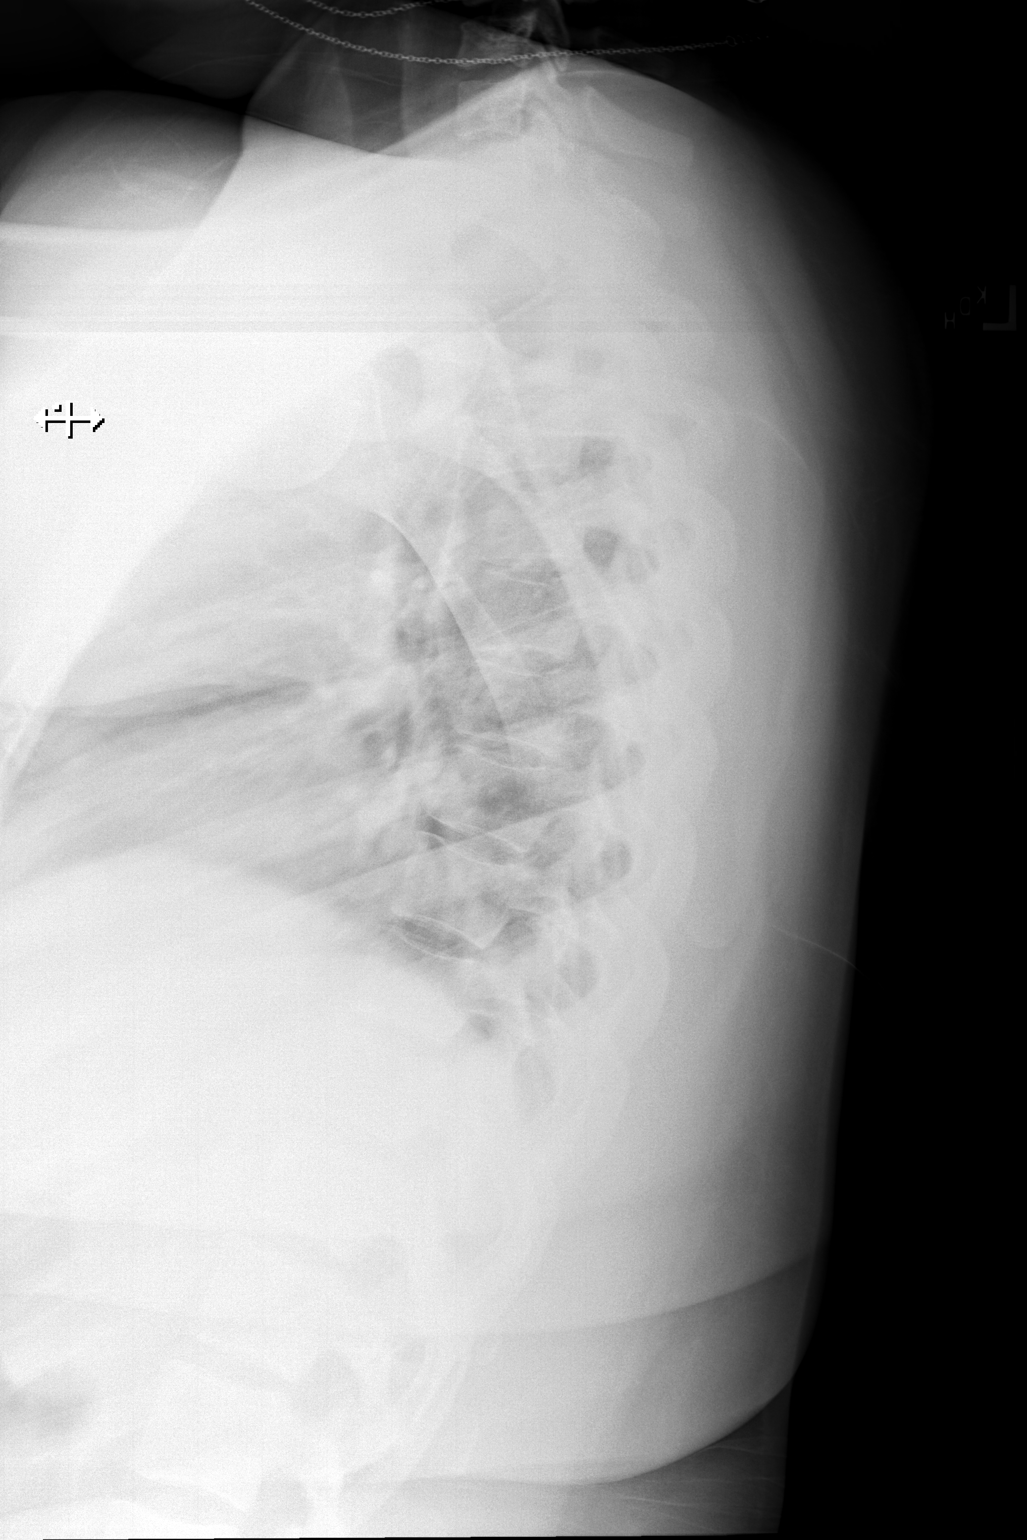

[2 of 2 positions shown; findings below may reference images not displayed]

FINDINGS: There is no evidence of thoracic spine fracture. Limited evaluation
of the upper lumbar spine on lateral view due to overlying soft
tissues. Alignment is normal. No other significant bone
abnormalities are identified.
IMPRESSION: No acute osseous abnormality.

## 2023-12-06 ENCOUNTER — Other Ambulatory Visit: Payer: Self-pay

## 2023-12-06 DIAGNOSIS — L02212 Cutaneous abscess of back [any part, except buttock]: Secondary | ICD-10-CM | POA: Insufficient documentation

## 2023-12-06 DIAGNOSIS — K649 Unspecified hemorrhoids: Secondary | ICD-10-CM | POA: Insufficient documentation

## 2023-12-06 NOTE — ED Triage Notes (Signed)
 Pt reports she developed a lump on her rectum yesterday, pt reports it is painful but denies blood. Pt states she has another one under her bra strap on her back. Denies drainage

## 2023-12-07 ENCOUNTER — Emergency Department
Admission: EM | Admit: 2023-12-07 | Discharge: 2023-12-07 | Disposition: A | Discharge status: Home / Self Care | Payer: Self-pay | Attending: Emergency Medicine | Admitting: Emergency Medicine

## 2023-12-07 DIAGNOSIS — K640 First degree hemorrhoids: Secondary | ICD-10-CM

## 2023-12-07 DIAGNOSIS — L0291 Cutaneous abscess, unspecified: Secondary | ICD-10-CM

## 2023-12-07 MED ORDER — DOXYCYCLINE HYCLATE 100 MG PO TABS: 100.0000 mg | ORAL_TABLET | Freq: Once | ORAL | Status: DC

## 2023-12-07 MED ORDER — PRAMOXINE-HC 1-1 % EX FOAM
1.0000 | Freq: Two times a day (BID) | CUTANEOUS | Status: DC
  Filled 2023-12-07: qty 10

## 2023-12-07 MED ORDER — DOXYCYCLINE HYCLATE 50 MG PO CAPS: 100.0000 mg | ORAL_CAPSULE | Freq: Two times a day (BID) | ORAL | 0 refills | Status: DC

## 2023-12-07 NOTE — ED Provider Notes (Signed)
 [facility] Provider Note    Event Date/Time   First MD Initiated Contact with Patient 12/07/23 [phone]     (approximate)   History   Abscess   HPI  Natasha Alvarez is a 33 y.o. female who presents to the ED from home with a chief complaint of painful lump on her back beneath her bra strap x 3 days, draining.  Complains of a second lump to her rectum yesterday which is painful.  Denies fever/chills, abdominal pain, nausea/vomiting or dizziness.  Denies history of diabetes.     Past Medical History  History reviewed. No pertinent past medical history.   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 Authorizing Provider  doxycycline (VIBRAMYCIN) 50 MG capsule Take 2 capsules (100 mg total) by mouth 2 (two) times daily. 12/07/23  Yes Irean Hong, MD  methocarbamol (ROBAXIN) 500 MG tablet Take 1 tablet (500 mg total) by mouth 4 (four) times daily. 02/18/22   Tommi Rumps, PA-C  naproxen (NAPROSYN) 500 MG tablet Take 1 tablet (500 mg total) by mouth 2 (two) times daily with a meal. 02/18/22   Tommi Rumps, PA-C  Prenatal Vit-Fe Fumarate-FA (MULTIVITAMIN-PRENATAL) 27-0.8 MG TABS tablet Take 1 tablet by mouth daily at 12 noon.     [provider]     Allergies  Amoxicillin   Family History   Family History  Problem Relation Age of Onset   Hypertension Mother    Breast cancer Neg Hx    Colon cancer Neg Hx    Ovarian cancer Neg Hx    Diabetes Neg Hx      Physical Exam  Triage Vital Signs: ED Triage Vitals  Encounter Vitals Group     BP 12/06/23 2039 (!) 164/93     Systolic BP Percentile --      Diastolic BP Percentile --      Pulse Rate 12/06/23 2039 84     Resp 12/06/23 2039 18     Temp 12/06/23 2039 98.4 F (36.9 C)     Temp src --      SpO2 12/06/23 2039 98 %     Weight 12/06/23 2038 255 lb (115.7 kg)     Height 12/06/23 2038 5\' 9"  (1.753 m)     Head Circumference --      Peak Flow --      Pain Score 12/06/23 2038 9     Pain Loc --      Pain Education --      Exclude from Growth Chart --     Updated Vital Signs: BP 130/80 (BP Location: Right Arm)   Pulse 70   Temp 98.4 F (36.9 C)  Resp 17   Ht 5\' 9"  (1.753 m)   Wt 115.7 kg   SpO2 97%   BMI 37.66 kg/m    General: Awake, no distress.  CV:  Good peripheral perfusion.  Resp:  Normal effort.  Abd:  Nontender.  No distention.  Other:  Skin to the thoracic area underneath bra strap reveals induration without warmth/erythema/fluctuance, central scab. External rectal exam: First-degree hemorrhoid noted easily reducible, nonbleeding, nonthrombosed.  No external buttock abscess or pilonidal cyst noted.   ED Results / Procedures / Treatments  Labs (all labs ordered are listed, but only abnormal results are displayed) Labs Reviewed - No data to display   EKG  None   RADIOLOGY None   Official radiology report(s): No results found.   PROCEDURES:  Critical Care performed: No  Procedures   MEDICATIONS ORDERED IN ED: Medications  doxycycline (VIBRA-TABS) tablet 100 mg (has no administration in time range)  pramoxine-hydrocortisone foam 1 applicator (has no administration in time range)     IMPRESSION /  MDM / ASSESSMENT AND PLAN / ED COURSE  I reviewed the triage vital signs and the nursing notes.                             33 year old female presenting with draining abscess to thoracic back, first-degree hemorrhoid.  Will place on doxycycline, Proctofoam for hemorrhoid and patient will follow-up with her PCP as needed.  Strict return precautions given.  Patient verbalizes understanding and agrees with plan of care.  Patient's presentation is most consistent with acute, uncomplicated illness.    FINAL CLINICAL IMPRESSION(S) / ED DIAGNOSES   Final diagnoses:  Abscess  Grade I hemorrhoids     Rx / DC Orders   ED Discharge Orders          Ordered    doxycycline (VIBRAMYCIN) 50 MG capsule  2 times daily        12/07/23 0302             Note:  This document was prepared using Dragon voice recognition software and may include unintentional dictation errors.   Irean Hong, MD 12/07/23 [phone]

## 2023-12-07 NOTE — Discharge Instructions (Signed)
 Take and finish antibiotic as prescribed.  Apply warm compress to upper back several times daily.  Take over-the-counter stool softener, sitz bath's, apply Proctofoam as needed for hemorrhoidal pain.  Return to the ER for worsening symptoms, persistent vomiting, fever or other concerns.

## 2024-06-04 ENCOUNTER — Other Ambulatory Visit: Payer: Self-pay

## 2024-06-04 ENCOUNTER — Emergency Department
Admission: EM | Admit: 2024-06-04 | Discharge: 2024-06-04 | Disposition: A | Discharge status: Home / Self Care | Payer: Self-pay | Attending: Emergency Medicine | Admitting: Emergency Medicine

## 2024-06-04 DIAGNOSIS — L03317 Cellulitis of buttock: Secondary | ICD-10-CM | POA: Insufficient documentation

## 2024-06-04 DIAGNOSIS — L0231 Cutaneous abscess of buttock: Secondary | ICD-10-CM | POA: Insufficient documentation

## 2024-06-04 MED ORDER — SULFAMETHOXAZOLE-TRIMETHOPRIM 800-160 MG PO TABS: 1.0000 | ORAL_TABLET | Freq: Two times a day (BID) | ORAL | 0 refills | Status: AC

## 2024-06-04 MED ORDER — SULFAMETHOXAZOLE-TRIMETHOPRIM 800-160 MG PO TABS
1.0000 | ORAL_TABLET | Freq: Once | ORAL | Status: AC
  Administered 2024-06-04: 1 via ORAL
  Filled 2024-06-04: qty 1

## 2024-06-04 MED ORDER — OXYCODONE-ACETAMINOPHEN 5-325 MG PO TABS
1.0000 | ORAL_TABLET | Freq: Once | ORAL | Status: AC
  Administered 2024-06-04: 1 via ORAL
  Filled 2024-06-04: qty 1

## 2024-06-04 MED ORDER — TETANUS-DIPHTH-ACELL PERTUSSIS 5-2-15.5 LF-MCG/0.5 IM SUSP
0.5000 mL | Freq: Once | INTRAMUSCULAR | Status: AC
  Administered 2024-06-04: 0.5 mL via INTRAMUSCULAR
  Filled 2024-06-04: qty 0.5

## 2024-06-04 MED ORDER — LIDOCAINE-EPINEPHRINE (PF) 2 %-1:200000 IJ SOLN
10.0000 mL | Freq: Once | INTRAMUSCULAR | Status: AC
  Administered 2024-06-04: 10 mL
  Filled 2024-06-04: qty 20

## 2024-06-04 NOTE — ED Provider Notes (Cosign Needed Addendum)
 [facility] Provider Note    Event Date/Time   First MD Initiated Contact with Patient 06/04/24 1617     (approximate)   History   Abscess   HPI  Natasha Alvarez is a 33 y.o. female  with a past medical history of Hemoglobin C trait presents to the emergency department with painful, draining area to right buttocks x 4 days.  Patient states it is draining clear fluid.  Denies fever or chills, numbness or tingling.  Patient states it hurts to sit down and brings tears to her eyes.  She has been doing Tylenol  and Aleve  at home as well as heating pads without much relief.  Patient does not take any blood thinners.  Patient states she is not currently pregnant.  Last tetanus injection unknown.   Physical Exam   Triage Vital Signs: ED Triage Vitals [06/04/24 1545]  Encounter Vitals Group     BP 115/79     Girls Systolic BP Percentile      Girls Diastolic BP Percentile      Boys Systolic BP Percentile      Boys Diastolic BP Percentile      Pulse Rate 93     Resp 20     Temp 99.1 F (37.3 C)     Temp Source Oral     SpO2 99 %     Weight      Height      Head Circumference      Peak Flow      Pain Score 10     Pain Loc      Pain Education      Exclude from Growth Chart     Most recent vital signs: Vitals:   06/04/24 1545  BP: 115/79  Pulse: 93  Resp: 20  Temp: 99.1 F (37.3 C)  SpO2: 99%    General: Awake, in no acute distress. Appears stated age. CV: Good peripheral perfusion. No edema.  Respiratory:Normal respiratory effort.  No respiratory distress.  GI: Soft, non-distended. Skin:Warm, dry, intact. 4x4 cm indurated area with minimal fluctuance. Area has surrounding erythema and moderately TTP on right buttocks, no drainage at this time.  Chaperone RN present for exam.  ED Results / Procedures / Treatments   Labs (all labs ordered are listed, but only abnormal results are displayed) Labs Reviewed - No data to  display   EKG     RADIOLOGY    PROCEDURES:  Critical Care performed: No Med Data Critical Care [email or web address]   .Incision and Drainage  Date/Time: 06/04/2024 6:43 PM  Performed by: Sheron Salm, PA-C Authorized by: Sheron Salm, PA-C   Consent:    Consent obtained:  Verbal   Consent given by:  Patient   Risks, benefits, and alternatives were discussed: yes     Risks discussed:  Bleeding, damage to other organs, infection, incomplete drainage and pain Universal protocol:    Procedure explained and questions answered to patient or proxy's satisfaction: yes     Immediately prior to procedure, a time out was called: yes     Patient identity confirmed:  Verbally with patient Location:    Type:  Abscess   Size:  4x4 cm   Location: right gluteal region/buttocks. Pre-procedure details:    Skin preparation:  Povidone-iodine Sedation:    Sedation type:  None Anesthesia:    Anesthesia method:  Local infiltration   Local anesthetic:  Lidocaine  2% WITH epi Procedure type:    Complexity:  Complex  Procedure details:    Incision types:  Single straight   Incision depth:  Subcutaneous   Wound management:  Probed and deloculated, irrigated with saline and extensive cleaning   Drainage:  Purulent and bloody   Drainage amount:  Moderate   Wound treatment:  Wound left open   Packing materials:  None Post-procedure details:    Procedure completion:  Tolerated well, no immediate complications     MEDICATIONS ORDERED IN ED: Medications  Tdap (ADACEL) injection 0.5 mL (0.5 mLs Intramuscular Given 06/04/24 1654)  lidocaine -EPINEPHrine  (XYLOCAINE  W/EPI) 2 %-1:200000 (PF) injection 10 mL (10 mLs Infiltration Given by Other 06/04/24 1654)  oxyCODONE -acetaminophen  (PERCOCET/ROXICET) 5-325 MG per tablet 1 tablet (1 tablet Oral Given 06/04/24 1652)  sulfamethoxazole-trimethoprim (BACTRIM DS) 800-160 MG per tablet 1 tablet (1 tablet Oral Given 06/04/24 1805)     IMPRESSION / MDM /  ASSESSMENT AND PLAN / ED COURSE  I reviewed the triage vital signs and the nursing notes.                              Differential diagnosis includes, but is not limited to, right gluteal abscess, cyst, cellulitis  Patient's presentation is most consistent with acute, uncomplicated illness.  Patient is a 33 year old female presenting with right gluteal pain. Chaperone RN present for exam and I&D procedure. On exam, this felt like an indurated cyst more than an abscess but I did decide to lance it to see if it would provide the patient some relief.  6 mL of lidocaine  2% with epi were injected for anesthesia followed by incision and drainage procedure which revealed some abscess fluid draining from the area.  She does have some surrounding cellulitis so I did provide her with 1 dose of Bactrim here and will send the remainder to her pharmacy of choice.  Please see procedure note for full details regarding the I&D.  Tetanus was updated and 1 dose of Percocet was given prior to the procedure for pain relief.  After drainage, patient states she feels much better at this time.  She is afebrile at this time, all VS within normal range.  Offered to pack the area, but states she has had trouble in increased pain with packing in the past, so I decided to just do a sterile dressing at this time.  Will have her follow-up with her primary care provider, an urgent care in the emergency department in 24 to 48 hours for wound recheck.  Wound care instructions discussed with the patient prior to discharge.  The patient may return to the emergency department for any new, worsening, or concerning symptoms. Patient was given the opportunity to ask questions; all questions were answered. Emergency department return precautions were discussed with the patient.  Patient is in agreement to the treatment plan.  Patient is stable for discharge.   FINAL CLINICAL IMPRESSION(S) / ED DIAGNOSES   Final diagnoses:  Abscess of  buttock, right  Cellulitis of buttock     Rx / DC Orders   ED Discharge Orders          Ordered    sulfamethoxazole-trimethoprim (BACTRIM DS) 800-160 MG tablet  2 times daily        06/04/24 1804             Note:  This document was prepared using Dragon voice recognition software and may include unintentional dictation errors.     Sheron [place], [place] 06/04/24 1850  Sheron Salm, PA-C 06/04/24 1906    Arlander [place], MD 06/06/24 [phone]

## 2024-06-04 NOTE — ED Triage Notes (Signed)
 Pt c/o abscess on R buttock that has been worsening since Friday. Pt reports small amount of drainage. No fever at home.

## 2024-06-04 NOTE — Discharge Instructions (Signed)
 You have been seen in the Emergency Department (ED) today for an abscess.  This was drained in the ED.  Please follow up with your doctor, in urgent care or in the ED in 24-48 hours for recheck of your wound.  Read through the additional discharge instructions included below regarding wound care recommendations.  Keep the wound clean and dry, though you may wash as you would normally.  Change the dressing twice daily. Please pick up and take antibiotics as prescribed.  Call your doctor sooner or return to the ED if you develop worsening signs of infection such as: increased redness, increased pain, pus, or fever.   Abscess An abscess is an infected area that contains a collection of pus and debris. It can occur in almost any part of the body. An abscess is also known as a furuncle or boil. CAUSES  An abscess occurs when tissue gets infected. This can occur from blockage of oil or sweat glands, infection of hair follicles, or a minor injury to the skin. As the body tries to fight the infection, pus collects in the area and creates pressure under the skin. This pressure causes pain. People with weakened immune systems have difficulty fighting infections and get certain abscesses more often.  SYMPTOMS Usually an abscess develops on the skin and becomes a painful mass that is red, warm, and tender. If the abscess forms under the skin, you may feel a moveable soft area under the skin. Some abscesses break open (rupture) on their own, but most will continue to get worse without care. The infection can spread deeper into the body and eventually into the bloodstream, causing you to feel ill.  DIAGNOSIS  Your caregiver will take your medical history and perform a physical exam. A sample of fluid may also be taken from the abscess to determine what is causing your infection. TREATMENT  Your caregiver may prescribe antibiotic medicines to fight the infection. However, taking antibiotics alone usually does not  cure an abscess. Your caregiver may need to make a small cut (incision) in the abscess to drain the pus. In some cases, gauze is packed into the abscess to reduce pain and to continue draining the area. HOME CARE INSTRUCTIONS  Only take over-the-counter or prescription medicines for pain, discomfort, or fever as directed by your caregiver. If you were prescribed antibiotics, take them as directed. Finish them even if you start to feel better. If gauze is used, follow your caregiver's directions for changing the gauze. To avoid spreading the infection: Keep your draining abscess covered with a bandage. Wash your hands well. Do not share personal care items, towels, or whirlpools with others. Avoid skin contact with others. Keep your skin and clothes clean around the abscess. Keep all follow-up appointments as directed by your caregiver. SEEK MEDICAL CARE IF:  You have increased pain, swelling, redness, fluid drainage, or bleeding. You have muscle aches, chills, or a general ill feeling. You have a fever. MAKE SURE YOU:  Understand these instructions. Will watch your condition. Will get help right away if you are not doing well or get worse. Document Released: 05/27/2005 Document Revised: 02/16/2012 Document Reviewed: 10/30/2011 [facility] Patient Information 2015 [place], [place]. This information is not intended to replace advice given to you by your health care provider. Make sure you discuss any questions you have with your health care provider.  Abscess Care After An abscess (also called a boil or furuncle) is an infected area that contains a collection of pus. Signs  and symptoms of an abscess include pain, tenderness, redness, or hardness, or you may feel a moveable soft area under your skin. An abscess can occur anywhere in the body. The infection may spread to surrounding tissues causing cellulitis. A cut (incision) by the surgeon was made over your abscess and the pus was drained out.  Gauze may have been packed into the space to provide a drain that will allow the cavity to heal from the inside outwards. The boil may be painful for 5 to 7 days. Most people with a boil do not have high fevers. Your abscess, if seen early, may not have localized, and may not have been lanced. If not, another appointment may be required for this if it does not get better on its own or with medications. HOME CARE INSTRUCTIONS  Only take over-the-counter or prescription medicines for pain, discomfort, or fever as directed by your caregiver. When you bathe, soak and then remove gauze or iodoform packs at least daily or as directed by your caregiver. You may then wash the wound gently with mild soapy water. Repack with gauze or do as your caregiver directs. SEEK IMMEDIATE MEDICAL CARE IF:  You develop increased pain, swelling, redness, drainage, or bleeding in the wound site. You develop signs of generalized infection including muscle aches, chills, fever, or a general ill feeling. An oral temperature above 102 F (38.9 C) develops, not controlled by medication. See your caregiver for a recheck if you develop any of the symptoms described above. If medications (antibiotics) were prescribed, take them as directed. Document Released: 03/05/2005 Document Revised: 11/09/2011 Document Reviewed: 10/31/2007 [facility] Patient Information 2015 [place], [place]. This information is not intended to replace advice given to you by your health care provider. Make sure you discuss any questions you have with your health care provider.  Cellulitis Cellulitis is an infection of the skin and the tissue beneath it. The infected area is usually red and tender. Cellulitis occurs most often in the arms and lower legs.  CAUSES  Cellulitis is caused by bacteria that enter the skin through cracks or cuts in the skin. The most common types of bacteria that cause cellulitis are staphylococci and streptococci. SIGNS AND SYMPTOMS   Redness and warmth. Swelling. Tenderness or pain. Fever. DIAGNOSIS  Your health care provider can usually determine what is wrong based on a physical exam. Blood tests may also be done. TREATMENT  Treatment usually involves taking an antibiotic medicine. HOME CARE INSTRUCTIONS  Take your antibiotic medicine as directed by your health care provider. Finish the antibiotic even if you start to feel better. Keep the infected arm or leg elevated to reduce swelling. Apply a warm cloth to the affected area up to 4 times per day to relieve pain. Take medicines only as directed by your health care provider. Keep all follow-up visits as directed by your health care provider. SEEK MEDICAL CARE IF:  You notice red streaks coming from the infected area. Your red area gets larger or turns dark in color. Your bone or joint underneath the infected area becomes painful after the skin has healed. Your infection returns in the same area or another area. You notice a swollen bump in the infected area. You develop new symptoms. You have a fever. SEEK IMMEDIATE MEDICAL CARE IF:  You feel very sleepy. You develop vomiting or diarrhea. You have a general ill feeling (malaise) with muscle aches and pains. MAKE SURE YOU:  Understand these instructions. Will watch your condition. Will get  help right away if you are not doing well or get worse. Document Released: 05/27/2005 Document Revised: 01/01/2014 Document Reviewed: 11/02/2011 [facility] Patient Information 2015 [place], [place]. This information is not intended to replace advice given to you by your health care provider. Make sure you discuss any questions you have with your health care provider.

## 2024-06-28 ENCOUNTER — Other Ambulatory Visit: Payer: Self-pay

## 2024-06-28 ENCOUNTER — Emergency Department
Admission: EM | Admit: 2024-06-28 | Discharge: 2024-06-28 | Disposition: A | Discharge status: Home / Self Care | Payer: Self-pay | Attending: Emergency Medicine | Admitting: Emergency Medicine

## 2024-06-28 ENCOUNTER — Encounter: Payer: Self-pay | Admitting: *Deleted

## 2024-06-28 DIAGNOSIS — W260XXA Contact with knife, initial encounter: Secondary | ICD-10-CM | POA: Insufficient documentation

## 2024-06-28 DIAGNOSIS — S61211A Laceration without foreign body of left index finger without damage to nail, initial encounter: Secondary | ICD-10-CM | POA: Insufficient documentation

## 2024-06-28 MED ORDER — LIDOCAINE HCL (PF) 1 % IJ SOLN
5.0000 mL | Freq: Once | INTRAMUSCULAR | Status: AC
  Administered 2024-06-28: 5 mL via INTRADERMAL
  Filled 2024-06-28: qty 5

## 2024-06-28 NOTE — Discharge Instructions (Signed)
 Follow-up with your regular doctor for suture removal in 1 week.  He can also go to urgent care, or return the emergency department. Please see any sign of infection placed during the ER. Keep the areas dry as possible.

## 2024-06-28 NOTE — ED Notes (Signed)
 PT in no acute distress prior to discharge. Discharged instructions reviewed, all questions answered and pt verbalized understanding at this time. Pt has all belongings with them at time of discharge.

## 2024-06-28 NOTE — ED Triage Notes (Signed)
 Pt to triage via wheelchair.  Pt has small laceration to base of left index finger.  Pt cut with a knife.  Bleeding controlled   pt alert.

## 2024-06-28 NOTE — ED Provider Notes (Signed)
 [facility] Provider Note    Event Date/Time   First MD Initiated Contact with Patient 06/28/24 1939     (approximate)   History   Laceration   HPI  Natasha Alvarez is a 33 y.o. female with no significant past medical history presents emergency department for laceration to the left index finger.  Patient was cooking dinner when she cut it with a knife.  Tdap is up-to-date.  Has good range of motion of her finger per the patient.      Physical Exam   Triage Vital Signs: ED Triage Vitals [06/28/24 1934]  Encounter Vitals Group     BP (!) 152/60     Girls Systolic BP Percentile      Girls Diastolic BP Percentile      Boys Systolic BP Percentile      Boys Diastolic BP Percentile      Pulse Rate 79     Resp 18     Temp 98.3 F (36.8 C)     Temp Source Oral     SpO2 99 %     Weight 255 lb (115.7 kg)     Height 5' 9 (1.753 m)     Head Circumference      Peak Flow      Pain Score 10     Pain Loc      Pain Education      Exclude from Growth Chart     Most recent vital signs: Vitals:   06/28/24 1934  BP: (!) 152/60  Pulse: 79  Resp: 18  Temp: 98.3 F (36.8 C)  SpO2: 99%     General: Awake, no distress.   CV:  Good peripheral perfusion. Resp:  Normal effort. Abd:  No distention.   Other:  Larie laceration noted at the base of the left index finger, no foreign body, full range of motion of the finger, neurovascular intact   ED Results / Procedures / Treatments   Labs (all labs ordered are listed, but only abnormal results are displayed) Labs Reviewed - No data to display   EKG     RADIOLOGY     PROCEDURES:   .Laceration Repair  Date/Time: 06/28/2024 8:29 PM  Performed by: Gasper Devere ORN, PA-C Authorized by: Gasper Devere ORN, PA-C   Consent:    Consent obtained:  Verbal   Consent given by:  Patient   Risks, benefits, and alternatives were discussed: yes     Risks discussed:  Infection, pain, retained foreign  body, tendon damage, poor cosmetic result, need for additional repair, nerve damage, poor wound healing and vascular damage   Alternatives discussed:  No treatment Universal protocol:    Procedure explained and questions answered to patient or proxy's satisfaction: yes     Immediately prior to procedure, a time out was called: yes     Patient identity confirmed:  Verbally with patient Anesthesia:    Anesthesia method:  Local infiltration   Local anesthetic:  Lidocaine  1% w/o epi Laceration details:    Location:  Finger   Finger location:  L index finger   Length (cm):  1 Pre-procedure details:    Preparation:  Patient was prepped and draped in usual sterile fashion Exploration:    Limited defect created (wound extended): no     Hemostasis achieved with:  Direct pressure   Imaging outcome: foreign body not noted     Wound exploration: wound explored through full range of motion and entire depth  of wound visualized     Wound extent: areolar tissue not violated, fascia not violated, no foreign body, no signs of injury, no nerve damage, no tendon damage, no underlying fracture and no vascular damage     Contaminated: no   Treatment:    Area cleansed with:  Povidone-iodine and saline   Amount of cleaning:  Standard   Irrigation solution:  Sterile saline   Irrigation method:  Tap   Debridement:  None   Undermining:  None   Scar revision: no   Skin repair:    Repair method:  Sutures   Suture size:  5-0   Suture material:  Nylon   Suture technique:  Simple interrupted   Number of sutures:  2 Approximation:    Approximation:  Close Repair type:    Repair type:  Simple Post-procedure details:    Dressing:  Non-adherent dressing   Procedure completion:  Tolerated well, no immediate complications   Critical Care:  no Chief Complaint  Patient presents with   Laceration      MEDICATIONS ORDERED IN ED: Medications  lidocaine  (PF) (XYLOCAINE ) 1 % injection 5 mL (has no  administration in time range)     IMPRESSION / MDM / ASSESSMENT AND PLAN / ED COURSE  I reviewed the triage vital signs and the nursing notes.                              Differential diagnosis includes, but is not limited to, laceration, abrasion, avulsion, tendon injury  Patient's presentation is most consistent with acute illness / injury with system symptoms.   Medications given: Xylocaine  intradermal  See procedure note for laceration repair   Patient tolerated procedure well.  She is to follow-up with her regular doctor for suture removal in 1 week.  Suture care instructions discussed.  She is in agreement treatment plan.  Discharged stable condition.   FINAL CLINICAL IMPRESSION(S) / ED DIAGNOSES   Final diagnoses:  Laceration of left index finger without foreign body without damage to nail, initial encounter     Rx / DC Orders   ED Discharge Orders     None        Note:  This document was prepared using Dragon voice recognition software and may include unintentional dictation errors.    Gasper Devere ORN, PA-C 06/28/24 2031    Willo Dunnings, MD 06/28/24 2222
